# Patient Record
Sex: Female | Born: 1940 | Race: White | Hispanic: No | Smoking: Current some day smoker
Health system: Southern US, Community
[De-identification: ages and names within clinical notes are randomized; demographics above are authoritative.]

## PROBLEM LIST (undated history)

## (undated) DIAGNOSIS — K219 Gastro-esophageal reflux disease without esophagitis: Secondary | ICD-10-CM

## (undated) DIAGNOSIS — F32A Depression, unspecified: Secondary | ICD-10-CM

## (undated) DIAGNOSIS — I10 Essential (primary) hypertension: Secondary | ICD-10-CM

## (undated) DIAGNOSIS — Z87442 Personal history of urinary calculi: Secondary | ICD-10-CM

## (undated) DIAGNOSIS — E7800 Pure hypercholesterolemia, unspecified: Secondary | ICD-10-CM

## (undated) DIAGNOSIS — F329 Major depressive disorder, single episode, unspecified: Secondary | ICD-10-CM

## (undated) DIAGNOSIS — G4730 Sleep apnea, unspecified: Secondary | ICD-10-CM

## (undated) DIAGNOSIS — J449 Chronic obstructive pulmonary disease, unspecified: Secondary | ICD-10-CM

## (undated) DIAGNOSIS — I2699 Other pulmonary embolism without acute cor pulmonale: Secondary | ICD-10-CM

## (undated) DIAGNOSIS — F319 Bipolar disorder, unspecified: Secondary | ICD-10-CM

## (undated) DIAGNOSIS — J439 Emphysema, unspecified: Secondary | ICD-10-CM

## (undated) DIAGNOSIS — M5136 Other intervertebral disc degeneration, lumbar region: Secondary | ICD-10-CM

## (undated) HISTORY — PX: TONSILLECTOMY: SUR1361

## (undated) HISTORY — DX: Emphysema, unspecified: J43.9

## (undated) HISTORY — DX: Other pulmonary embolism without acute cor pulmonale: I26.99

## (undated) HISTORY — DX: Chronic obstructive pulmonary disease, unspecified: J44.9

## (undated) HISTORY — PX: CATARACT EXTRACTION, BILATERAL: SHX1313

## (undated) HISTORY — PX: BACK SURGERY: SHX140

## (undated) HISTORY — PX: ABDOMINAL HYSTERECTOMY: SHX81

## (undated) HISTORY — DX: Other intervertebral disc degeneration, lumbar region: M51.36

---

## 1998-11-07 ENCOUNTER — Inpatient Hospital Stay (HOSPITAL_COMMUNITY): Admission: EM | Admit: 1998-11-07 | Discharge: 1998-11-09 | Payer: Self-pay | Admitting: Cardiology

## 2000-01-23 ENCOUNTER — Inpatient Hospital Stay (HOSPITAL_COMMUNITY): Admission: EM | Admit: 2000-01-23 | Discharge: 2000-01-24 | Payer: Self-pay | Admitting: Emergency Medicine

## 2000-01-23 ENCOUNTER — Encounter: Payer: Self-pay | Admitting: Emergency Medicine

## 2000-01-24 ENCOUNTER — Encounter: Payer: Self-pay | Admitting: Cardiology

## 2000-05-12 ENCOUNTER — Ambulatory Visit (HOSPITAL_COMMUNITY): Admission: RE | Admit: 2000-05-12 | Discharge: 2000-05-12 | Payer: Self-pay | Admitting: Orthopedic Surgery

## 2000-05-12 ENCOUNTER — Encounter: Payer: Self-pay | Admitting: Orthopedic Surgery

## 2001-02-19 ENCOUNTER — Encounter: Payer: Self-pay | Admitting: Neurosurgery

## 2001-02-24 ENCOUNTER — Inpatient Hospital Stay (HOSPITAL_COMMUNITY): Admission: AD | Admit: 2001-02-24 | Discharge: 2001-02-28 | Payer: Self-pay | Admitting: Neurosurgery

## 2001-02-24 ENCOUNTER — Encounter: Payer: Self-pay | Admitting: Neurosurgery

## 2001-04-16 ENCOUNTER — Encounter: Payer: Self-pay | Admitting: Neurosurgery

## 2001-04-16 ENCOUNTER — Encounter: Admission: RE | Admit: 2001-04-16 | Discharge: 2001-04-16 | Payer: Self-pay | Admitting: Neurosurgery

## 2001-05-15 ENCOUNTER — Encounter: Admission: RE | Admit: 2001-05-15 | Discharge: 2001-05-15 | Payer: Self-pay | Admitting: Neurosurgery

## 2001-05-15 ENCOUNTER — Encounter: Payer: Self-pay | Admitting: Neurosurgery

## 2001-08-20 ENCOUNTER — Ambulatory Visit (HOSPITAL_COMMUNITY): Admission: RE | Admit: 2001-08-20 | Discharge: 2001-08-20 | Payer: Self-pay | Admitting: Internal Medicine

## 2001-08-20 ENCOUNTER — Encounter: Payer: Self-pay | Admitting: Internal Medicine

## 2001-09-17 ENCOUNTER — Encounter: Payer: Self-pay | Admitting: Neurosurgery

## 2001-09-17 ENCOUNTER — Ambulatory Visit (HOSPITAL_COMMUNITY): Admission: RE | Admit: 2001-09-17 | Discharge: 2001-09-17 | Payer: Self-pay | Admitting: Neurosurgery

## 2001-11-13 ENCOUNTER — Encounter: Payer: Self-pay | Admitting: Neurosurgery

## 2001-11-13 ENCOUNTER — Encounter: Admission: RE | Admit: 2001-11-13 | Discharge: 2001-11-13 | Payer: Self-pay | Admitting: Neurosurgery

## 2002-01-25 ENCOUNTER — Encounter: Payer: Self-pay | Admitting: Emergency Medicine

## 2002-01-25 ENCOUNTER — Emergency Department (HOSPITAL_COMMUNITY): Admission: EM | Admit: 2002-01-25 | Discharge: 2002-01-25 | Payer: Self-pay | Admitting: Emergency Medicine

## 2002-01-26 ENCOUNTER — Encounter: Admission: RE | Admit: 2002-01-26 | Discharge: 2002-04-26 | Payer: Self-pay

## 2003-07-13 ENCOUNTER — Ambulatory Visit (HOSPITAL_COMMUNITY): Admission: RE | Admit: 2003-07-13 | Discharge: 2003-07-13 | Payer: Self-pay | Admitting: *Deleted

## 2003-11-18 ENCOUNTER — Emergency Department (HOSPITAL_COMMUNITY): Admission: EM | Admit: 2003-11-18 | Discharge: 2003-11-18 | Payer: Self-pay | Admitting: Emergency Medicine

## 2005-02-14 ENCOUNTER — Emergency Department (HOSPITAL_COMMUNITY): Admission: EM | Admit: 2005-02-14 | Discharge: 2005-02-14 | Payer: Self-pay | Admitting: Emergency Medicine

## 2005-09-02 ENCOUNTER — Emergency Department (HOSPITAL_COMMUNITY): Admission: EM | Admit: 2005-09-02 | Discharge: 2005-09-02 | Payer: Self-pay | Admitting: Emergency Medicine

## 2005-09-12 ENCOUNTER — Encounter (HOSPITAL_COMMUNITY): Admission: RE | Admit: 2005-09-12 | Discharge: 2005-10-05 | Payer: Self-pay | Admitting: Orthopaedic Surgery

## 2005-11-19 ENCOUNTER — Ambulatory Visit (HOSPITAL_COMMUNITY): Admission: RE | Admit: 2005-11-19 | Discharge: 2005-11-19 | Payer: Self-pay | Admitting: Ophthalmology

## 2005-12-17 ENCOUNTER — Ambulatory Visit (HOSPITAL_COMMUNITY): Admission: RE | Admit: 2005-12-17 | Discharge: 2005-12-17 | Payer: Self-pay | Admitting: Ophthalmology

## 2006-02-03 ENCOUNTER — Encounter: Admission: RE | Admit: 2006-02-03 | Discharge: 2006-02-10 | Payer: Self-pay | Admitting: Orthopaedic Surgery

## 2009-05-26 ENCOUNTER — Emergency Department (HOSPITAL_COMMUNITY): Admission: EM | Admit: 2009-05-26 | Discharge: 2009-05-26 | Payer: Self-pay | Admitting: Emergency Medicine

## 2009-06-14 ENCOUNTER — Encounter: Payer: Self-pay | Admitting: Cardiology

## 2009-07-13 ENCOUNTER — Encounter: Payer: Self-pay | Admitting: Cardiology

## 2009-08-10 ENCOUNTER — Encounter: Payer: Self-pay | Admitting: Cardiology

## 2009-08-22 ENCOUNTER — Ambulatory Visit: Payer: Self-pay | Admitting: Cardiology

## 2009-08-22 ENCOUNTER — Telehealth (INDEPENDENT_AMBULATORY_CARE_PROVIDER_SITE_OTHER): Payer: Self-pay | Admitting: *Deleted

## 2009-08-22 ENCOUNTER — Encounter (INDEPENDENT_AMBULATORY_CARE_PROVIDER_SITE_OTHER): Payer: Self-pay | Admitting: *Deleted

## 2009-08-22 DIAGNOSIS — I2510 Atherosclerotic heart disease of native coronary artery without angina pectoris: Secondary | ICD-10-CM | POA: Insufficient documentation

## 2009-08-22 DIAGNOSIS — I10 Essential (primary) hypertension: Secondary | ICD-10-CM

## 2009-08-22 DIAGNOSIS — F17200 Nicotine dependence, unspecified, uncomplicated: Secondary | ICD-10-CM

## 2009-08-22 DIAGNOSIS — E785 Hyperlipidemia, unspecified: Secondary | ICD-10-CM

## 2009-08-22 DIAGNOSIS — I739 Peripheral vascular disease, unspecified: Secondary | ICD-10-CM

## 2009-08-30 ENCOUNTER — Ambulatory Visit: Payer: Self-pay | Admitting: Cardiology

## 2009-08-30 ENCOUNTER — Encounter: Payer: Self-pay | Admitting: Cardiology

## 2009-08-31 ENCOUNTER — Telehealth (INDEPENDENT_AMBULATORY_CARE_PROVIDER_SITE_OTHER): Payer: Self-pay | Admitting: *Deleted

## 2009-10-16 ENCOUNTER — Telehealth (INDEPENDENT_AMBULATORY_CARE_PROVIDER_SITE_OTHER): Payer: Self-pay | Admitting: *Deleted

## 2009-12-12 ENCOUNTER — Ambulatory Visit: Payer: Self-pay | Admitting: Cardiology

## 2009-12-16 ENCOUNTER — Emergency Department (HOSPITAL_COMMUNITY)
Admission: EM | Admit: 2009-12-16 | Discharge: 2009-12-16 | Payer: Self-pay | Source: Home / Self Care | Admitting: Emergency Medicine

## 2010-02-08 NOTE — Letter (Signed)
Summary: External Correspondence/ VISIT [facility]  External Correspondence/ VISIT [facility]   Imported By: Dorise Hiss 08/15/2009 15:44:09  _____________________________________________________________________  External Attachment:    Type:   Image     Comment:   External Document

## 2010-02-08 NOTE — Letter (Signed)
Summary: External Correspondence/ VISIT [facility]  External Correspondence/ VISIT [facility]   Imported By: Dorise Hiss 08/15/2009 15:41:35  _____________________________________________________________________  External Attachment:    Type:   Image     Comment:   External Document

## 2010-02-08 NOTE — Progress Notes (Signed)
Summary: ? CONTINUE LISINOPRIL WITH AMLODIPINE  Phone Note Call from Patient Call back at Home Phone [phone]   Caller: Patient Call For: nurse Summary of Call: patient called to confirm whether or not she was to stay on her lisinopril in additon to her new med she was started on. Nurse informed her that unless she was instructed to stop it, she should stay on this. After review of chart, med should still be taken. Patient informed to stay on lisinopril. Initial call taken by: Carlye Grippe,  August 31, 2009 3:03 PM

## 2010-02-08 NOTE — Medication Information (Signed)
Summary: RX Folder/ MEDICATION SHEETS [facility]  RX Folder/ MEDICATION SHEETS [facility]   Imported By: Dorise Hiss 08/15/2009 15:39:54  _____________________________________________________________________  External Attachment:    Type:   Image     Comment:   External Document

## 2010-02-08 NOTE — Assessment & Plan Note (Signed)
Summary: 2 mo r/s from cxl 10/17   Visit Type:  Follow-up Primary Christine Rogers:  Dr. [name]  CC:  HTN.  History of Present Illness: The patient presents for followup of difficult to control hypertension. At the last visit I started amlodipine. She tolerated this and her blood pressures have been well controlled. He denies any cardiovascular symptoms such as chest discomfort, neck or arm discomfort. She has had no palpitations, presyncope or syncope. She has had no weight gain or edema. She has some burning left lower leg discomfort. However, recent ABIs demonstrated only very mild decreased flow. Stress perfusion study was recently normal.  Preventive Screening-Counseling & Management  Alcohol-Tobacco     Smoking Status: current     Smoking Cessation Counseling: yes     Packs/Day: 1 PPD  Current Medications (verified): 1)  Lisinopril 2.5 Mg Tabs (Lisinopril) .... Take 1 Tablet By Mouth Once A Day 2)  Oxycontin 80 Mg Xr12h-Tab (Oxycodone Hcl) .... Take 1 Tablet By Mouth Three Times A Day As Needed 3)  Pantoprazole Sodium 40 Mg Tbec (Pantoprazole Sodium) .... Take 1 Tablet By Mouth Once A Day 4)  Abilify 20 Mg Tabs (Aripiprazole) .... Take 1 Tablet By Mouth Once A Day 5)  Effexor Xr 75 Mg Xr24h-Cap (Venlafaxine Hcl) .... Take 1 Tablet By Mouth Once A Day 6)  Percocet 10-325 Mg Tabs (Oxycodone-Acetaminophen) .... As Needed 7)  Amlodipine Besylate 2.5 Mg Tabs (Amlodipine Besylate) .... Take One Tablet By Mouth Daily 8)  Lipitor 20 Mg Tabs (Atorvastatin Calcium) .... Take One Tablet By Mouth At Bedtime.  Allergies (verified): 1)  ! Demerol 2)  ! Morphine 3)  ! Cipro  Comments:  Nurse/Medical Assistant: The patient is currently on medications but does not know the name or dosage at this time. Instructed to contact our office with details. Will update medication list at that time.  Past History:  Past Medical History: Reviewed history from 08/22/2009 and no changes  required. HTN GERD Depression Chronic lumbar back pain CAD  Past Surgical History: Reviewed history from 08/22/2009 and no changes required. Decompressive laminectomy, stabilization at L4-5 using Tangent   allograft wedges and CD Horizon pedicle screw system. Trigger finger release Cataracts Hysterectomy  Social History: Packs/Day:  1 PPD  Review of Systems       As stated in the HPI and negative for all other systems.   Vital Signs:  Patient profile:   70 year old female Height:      61 inches Weight:      99 pounds Pulse rate:   85 / minute BP sitting:   144 / 87  (left arm) Cuff size:   regular  Vitals Entered By: Carlye Grippe (December 12, 2009 10:24 AM)  Physical Exam  General:  Well developed, well nourished, in no acute distress. Head:  normocephalic and atraumatic Neck:  Neck supple, no JVD. No masses, thyromegaly or abnormal cervical nodes. Chest Wall:  no deformities or breast masses noted Lungs:  Clear bilaterally to auscultation and percussion. Abdomen:  Bowel sounds positive; abdomen soft and non-tender without masses, organomegaly, or hernias noted. No hepatosplenomegaly. Msk:  Back normal, normal gait. Muscle strength and tone normal. Extremities:  No clubbing or cyanosis. Neurologic:  Alert and oriented x 3. Cervical Nodes:  no significant adenopathy Psych:  Normal affect.   Detailed Cardiovascular Exam  Neck    Carotids: Carotids full and equal bilaterally without bruits.      Neck Veins: Normal, no JVD.  Heart    Inspection: no deformities or lifts noted.      Palpation: normal PMI with no thrills palpable.      Auscultation: regular rate and rhythm, S1, S2 without murmurs, rubs, gallops, or clicks.    Vascular    Abdominal Aorta: no palpable masses, pulsations, or audible bruits.      Femoral Pulses: normal femoral pulses bilaterally, left and right bruit    Pedal Pulses: diminished left dorsalis pedis pulse and diminished left  posterior tibial pulse.      Radial Pulses: normal radial pulses bilaterally.      Peripheral Circulation: no clubbing, cyanosis, or edema noted with normal capillary refill.     Impression & Recommendations:  Problem # 1:  TOBACCO ABUSE (ICD-305.1) We again discussed the need to stop smoking we discussed a specific strategy.  Problem # 2:  ESSENTIAL HYPERTENSION, BENIGN (ICD-401.1) Her blood pressure is now well controlled on the meds as listed. She will continue with this regimen.  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  Patient Instructions: 1)  Follow up as needed.

## 2010-02-08 NOTE — Letter (Signed)
Summary: External Correspondence/ VISIT [facility]  External Correspondence/ VISIT [facility]   Imported By: Dorise Hiss 08/15/2009 15:42:57  _____________________________________________________________________  External Attachment:    Type:   Image     Comment:   External Document

## 2010-02-08 NOTE — Progress Notes (Signed)
Summary: PHONE: BP MEDICATION  Phone Note Call from Patient Call back at Home Phone [phone]   Caller: Patient Summary of Call: Wants to know when is the best time to take her BP medication.  Initial call taken by: Zachary George,  August 22, 2009 4:34 PM  Follow-up for Phone Call        Pt notified she can take Amlodipine in the morning.  She was also notified to start Lipitor 20mg  per Dr. Antoine Poche. Follow-up by: Cyril Loosen, RN, BSN,  August 22, 2009 5:26 PM    New/Updated Medications: LIPITOR 20 MG TABS (ATORVASTATIN CALCIUM) Take one tablet by mouth at bedtime. Prescriptions: LIPITOR 20 MG TABS (ATORVASTATIN CALCIUM) Take one tablet by mouth at bedtime.  #30 x 6   Entered by:   Cyril Loosen, RN, BSN   Authorized by:   Rollene Rotunda, MD, [facility]   Signed by:   Cyril Loosen, RN, BSN on 08/22/2009   Method used:   Electronically to        [facility] (retail)       [address]       [place], [place]  [address]       Ph: [phone]       Fax: [phone]   RxID:   [id number]

## 2010-02-08 NOTE — Progress Notes (Signed)
Summary: Pending Labs  Phone Note Call from Patient Call back at Home Phone [phone]   Summary of Call: Pt would like to know if she should have labs (FLP/LFT) done before her appt with Dr. Antoine Poche on 10/17. Notified pt she could do labs on 10/13 or 10/14. Pt is aware to be NPO for labs. Initial call taken by: Cyril Loosen, RN, BSN,  October 16, 2009 9:16 AM

## 2010-02-08 NOTE — Progress Notes (Signed)
----   Converted from flag ---- ---- 08/22/2009 4:34 PM, Rollene Rotunda, MD, [facility] wrote: She needs lipitor 20 mg daily as well ------------------------------

## 2010-02-08 NOTE — Letter (Signed)
Summary: Lexiscan or Dobutamine Pharmacist, community at [facility]  [address]   [place], [place] [address]   Phone: [phone]  Fax: [phone]      [facility] Cardiovascular Services  Lexiscan or Dobutamine Cardiolite Strss Test    [facility]  Appointment Date:_  Appointment Time:_  Your doctor has ordered a CARDIOLITE STRESS TEST using a medication to stimulate exercise so that you will not have to walk on the treadmill to determine the condition of your heart during stress. If you take blood pressure medication, ask your doctor if you should take it the day of your test. You should not have anything to eat or drink at least 4 hours before your test is scheduled, and no caffeine, including decaffeinated tea and coffee, chocolate, and soft drinks for 24 hours before your test.  You will need to register at the Outpatient/Main Entrance at the hospital 15 minutes before your appointment time. It is a good idea to bring a copy of your order with you. They will direct you to the Diagnostic Imaging (Radiology) Department.  You will be asked to undress from the waist up and given a hospital gown to wear, so dress comfortably from the waist down for example: Sweat pants, shorts, or skirt Rubber soled lace up shoes (tennis shoes)  Plan on about three hours from registration to release from the hospital  You may take your medications with water the morning of your test.   If the results of your test are normal or stable, you will receive a letter. If they are abnormal, the nurse will contact you by phone.

## 2010-02-08 NOTE — Letter (Signed)
Summary: Discharge Summary/ [place]  Discharge Summary/ [place]   Imported By: Dorise Hiss 08/22/2009 08:42:46  _____________________________________________________________________  External Attachment:    Type:   Image     Comment:   External Document

## 2010-02-08 NOTE — Cardiovascular Report (Signed)
Summary: Cardiac Catheterization  Cardiac Catheterization   Imported By: Dorise Hiss 08/22/2009 08:38:57  _____________________________________________________________________  External Attachment:    Type:   Image     Comment:   External Document

## 2010-02-08 NOTE — Progress Notes (Signed)
Summary: Office Visit/ BLOOD PRESSURE READINGS  Office Visit/ BLOOD PRESSURE READINGS   Imported By: Dorise Hiss 08/23/2009 09:21:16  _____________________________________________________________________  External Attachment:    Type:   Image     Comment:   External Document

## 2010-02-08 NOTE — Assessment & Plan Note (Signed)
Summary: NP-VIOTLE   Visit Type:  Initial Consult Primary Elicia Lui:  Dr. [name]  CC:  HTN.  History of Present Illness: The patient presents for evaluation of difficult to control hypertension. Apparently it has been somewhat labile. She brings a blood pressure diary today that suggest systolic blood pressures consistently in the 160s. She apparently said some lower blood pressures with symptoms when she has been on a higher dose of lisinopril. She's never had any presyncope or syncope. We had seen this patient in the past for evaluation of chest pain. She had a catheterization most recently in 2002 with a 50-70% LAD stenosis. This was managed medically. She's had no further studies since that time. She is sedentary doing some light housekeeping. She says she somewhat limited by leg and calf cramping. She denies any chest pressure, neck or arm discomfort. She denies any palpitations, PND or orthopnea. She has no shortness of breath.  Preventive Screening-Counseling & Management  Alcohol-Tobacco     Smoking Status: current     Packs/Day: 1.0     Year Started: 30 yrs  Current Medications (verified): 1)  Lisinopril 2.5 Mg Tabs (Lisinopril) .... Take 1 Tablet By Mouth Once A Day 2)  Oxycontin 80 Mg Xr12h-Tab (Oxycodone Hcl) .... Take 1 Tablet By Mouth Three Times A Day As Needed 3)  Pantoprazole Sodium 40 Mg Tbec (Pantoprazole Sodium) .... Take 1 Tablet By Mouth Once A Day 4)  Abilify 20 Mg Tabs (Aripiprazole) .... Take 1 Tablet By Mouth Once A Day 5)  Effexor Xr 75 Mg Xr24h-Cap (Venlafaxine Hcl) .... Take 1 Tablet By Mouth Once A Day 6)  Percocet 10-325 Mg Tabs (Oxycodone-Acetaminophen) .... As Needed  Allergies: 1)  ! Demerol 2)  ! Morphine 3)  ! Cipro  Past History:  Past Medical History: HTN GERD Depression Chronic lumbar back pain CAD  Past Surgical History: Decompressive laminectomy, stabilization at L4-5 using Tangent   allograft wedges and CD Horizon pedicle  screw system. Trigger finger release Cataracts Hysterectomy  Family History: Father died age 9 MI Brother died age 15 MI Brother CABG age 58  Social History: Divorced Lives with two children Five years Tobacco 35 years 1ppdSmoking Status:  current Packs/Day:  1.0  Review of Systems       As stated in the HPI and negative for all other systems.   Vital Signs:  Patient profile:   70 year old female Height:      61 inches Weight:      107 pounds BMI:     20.29 Pulse rate:   74 / minute BP sitting:   162 / 83  (left arm) Cuff size:   regular  Vitals Entered By: Hoover Brunette, LPN (August 22, 2009 3:14 PM) CC: HTN Is Patient Diabetic? No Comments uncontrolled blood pressure    Physical Exam  General:  Well developed, well nourished, in no acute distress. Head:  normocephalic and atraumatic Eyes:  PERRLA/EOM intact; conjunctiva and lids normal. Mouth:  Edentulous. Oral mucosa normal. Neck:  Neck supple, no JVD. No masses, thyromegaly or abnormal cervical nodes. Chest Wall:  no deformities or breast masses noted Lungs:  Clear bilaterally to auscultation and percussion. Abdomen:  Bowel sounds positive; abdomen soft and non-tender without masses, organomegaly, or hernias noted. No hepatosplenomegaly. Msk:  Back normal, normal gait. Muscle strength and tone normal. Extremities:  No clubbing or cyanosis. Neurologic:  Alert and oriented x 3. Skin:  Intact without lesions or rashes. Cervical Nodes:  no significant  adenopathy Axillary Nodes:  no significant adenopathy Inguinal Nodes:  no significant adenopathy Psych:  Normal affect.   Detailed Cardiovascular Exam  Neck    Carotids: Carotids full and equal bilaterally without bruits.      Neck Veins: Normal, no JVD.    Heart    Inspection: no deformities or lifts noted.      Palpation: normal PMI with no thrills palpable.      Auscultation: regular rate and rhythm, S1, S2 without murmurs, rubs, gallops, or clicks.      Vascular    Abdominal Aorta: no palpable masses, pulsations, or audible bruits.      Femoral Pulses: normal femoral pulses bilaterally, left bruit    Pedal Pulses: diminished left dorsalis pedis pulse and diminished left posterior tibial pulse.      Radial Pulses: normal radial pulses bilaterally.      Peripheral Circulation: no clubbing, cyanosis, or edema noted with normal capillary refill.     EKG  Procedure date:  08/22/2009  Findings:      sinus rhythm, rate 72, axis within normal limits, intervals within normal limits, premature ventricular contractions, lateral T wave inversions without old EKGs for comparison  Impression & Recommendations:  Problem # 1:  ESSENTIAL HYPERTENSION, BENIGN (ICD-401.1) Apparently she had some labile hypertension. She didn't tolerate a higher dose of ACE inhibitor so I will start Norvasc 2.5 mg daily. She will keep a blood pressure diary at home and from this we will guide further meds titration.  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 She needs screening stress testing but says she wouldn't be to walk on a treadmill and so she will have a pharmacologic perfusion study. She needs aggressive risk reduction. Orders: Nuclear Med (Nuc Med)  Problem # 3:  TOBACCO ABUSE (ICD-305.1) We had a long discussion about the need to stop smoking (greater than 3 minutes).  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 I will check ABIs.  Problem # 5:  DYSLIPIDEMIA (ICD-272.4) I reviewed recent lipids. She needs a statin and I would start with Lipitor 20 mg daily for goal LDL less than 70.  Other Orders: EKG w/ Interpretation (93000) ABI (ABI)  Patient Instructions: 1)  Follow up with Dr. Antoine Poche on Monday, October 23, 2009 at 3:30pm. 2)  Your physician has  requested that you have an ankle brachial index (ABI). During this test an ultrasound and blood pressure cuff are used to evaluate the arteries that supply the arms and legs with blood. Allow thirty minutes for this exam. There are no restrictions or special instructions. 3)  Your physician has requested that you have an Best boy.  For further information please visit [email or web address]  Please follow instruction sheet, as given. 4)  Amlodipine 2.5mg  by mouth once daily. Prescriptions: AMLODIPINE BESYLATE 2.5 MG TABS (AMLODIPINE BESYLATE) Take one tablet by mouth daily  #30 x 6   Entered by:   Cyril Loosen, RN, BSN   Authorized by:   Rollene Rotunda, MD, [facility]   Signed by:   Cyril Loosen, RN, BSN on 08/22/2009   Method used:   Electronically to        [facility] (retail)       [address]       [place],  [place]  [address]       Ph: [phone]       Fax: [phone]   RxID:   [id number]   Handout requested.  I have reviewed and approved all prescriptions at the time of this visit. Rollene Rotunda, MD, [facility]  August 22, 2009 4:34 PM

## 2010-03-26 LAB — BASIC METABOLIC PANEL
BUN: 15 mg/dL (ref 6–23)
CO2: 24 mEq/L (ref 19–32)
Calcium: 8.8 mg/dL (ref 8.4–10.5)
Chloride: 104 mEq/L (ref 96–112)
GFR calc Af Amer: >60 mL/min (ref >60)
GFR calc non Af Amer: 58 mL/min — ABNORMAL LOW (ref >60)
Glucose, Bld: 88 mg/dL (ref 70–99)
Potassium: 3.5 mEq/L (ref 3.5–5.1)
Sodium: 136 mEq/L (ref 135–145)

## 2010-03-26 LAB — DIFFERENTIAL
Basophils Absolute: 0.1 10*3/uL (ref 0.0–0.1)
Basophils Relative: 1 % (ref 0–1)
Eosinophils Absolute: 0.1 10*3/uL (ref 0.0–0.7)
Eosinophils Relative: 1 % (ref 0–5)
Lymphocytes Relative: 19 % (ref 12–46)
Monocytes Absolute: 0.5 10*3/uL (ref 0.1–1.0)
Monocytes Relative: 6 % (ref 3–12)
Neutro Abs: 6.2 10*3/uL (ref 1.7–7.7)
Neutrophils Relative %: 74 % (ref 43–77)

## 2010-03-26 LAB — CBC: MCHC: 35.5 g/dL (ref 30.0–36.0)

## 2010-05-25 NOTE — Cardiovascular Report (Signed)
[place]. [facility]  Patient:    Christine Rogers, Christine Rogers                       MRN: [id number] Proc. Date: 01/24/00 Adm. Date:  [id number] Disc. Date: [id number] Attending:  Junious Silk CC:         Colon Flattery, D.O.  Madolyn Frieze Jens Som, M.D. [facility], [address], [place], [place] [address]  Cardiac Catheterization Laboratory   Cardiac Catheterization  PROCEDURE:  Selective coronary angiography, left ventricular angiography - Judkins technique.  INDICATIONS:  The patient is a 70 year old white female with a history of coronary artery disease with a previous study in 2000 revealing 70% mid LAD stenosis.  She now presents with an episode of precordial chest pain, severe, persisting 15 minutes associated with diaphoresis and shortness of breath.  RESULTS:  PRESSURES:  Aortic:  168/76, LV 160/17.  ANGIOGRAPHY:  There was no intracoronary calcification. 1. Left main:  The left main coronary artery was normal. 2. Left anterior descending:  The left anterior descending revealed    30% lesion in the first diagonal branch, 50-70% in the mid LAD,    somewhat more significant in the LAO view.  There was no significant    change with intracoronary nitroglycerin. 3. Circumflex:  The circumflex coronary was normal. 4. Right coronary artery:  The right coronary artery was normal. 5. Left ventricle:  The left ventricle revealed a normal EF.  There is a    question of focal hypokinesis in the distal anterior wall.  SUMMARY:  One-vessel disease as noted above.  I do not think there is significant change from the previous study.  I have suggested medical therapy and outpatient Cardiolite. DD:  01/24/00 TD:  01/25/00 Job: [id number]

## 2010-05-25 NOTE — H&P (Signed)
Christine Rogers, WALTER                          ACCOUNT NO.:  [id number]   MEDICAL RECORD NO.:  [id number]                   PATIENT TYPE:  REC   LOCATION:  [facility]PC                                  FACILITY:  [facility]   PHYSICIAN:  Zachary George, DO                      DATE OF BIRTH:  1940-05-01   DATE OF ADMISSION:  01/26/2002  DATE OF DISCHARGE:                                HISTORY & PHYSICAL   Dear Dr. Dewaine Conger:  Thank you very much for kindly referring this [facility] to the [facility] for evaluation.  She was evaluated in our clinic  today.  Please refer to the following for details regarding the history and  physical examination and treatment plan.  Once again, thank you for allowing  [place] to participate in the care of her.   CHIEF COMPLAINT:  Low back pain.   HISTORY OF PRESENT ILLNESS:  The patient is a pleasant 70 year old right-  hand dominant female who complains of a several year history of low back  pain with pain radiating to her left hip.  The patient underwent lumbar  fusion at L4-5 on February 24, 2001, which she states helped her hip pain  but now she has increased low back pain over the past several months.  She  states that she cannot sit and cannot walk with any degree of comfort  secondary to the pain.  She states her legs feel weak and her feet hurt and  ache.  She also complains of some newer onset pain in the right  thoracolumbar paraspinous region and she attributes this to the way she  sits.  She also describes pain with touching the skin at the inferior  portion of her incision on the left side which causes a fluttering  sensation radiating to her left hip.  Her pain today is a 7/10 on a  subjective scale.  She describes it as constant, sharp.  Her symptoms are  worse with bending, sitting and working and improved to some degree with  medications.   She had been on Vicodin 5 mg per her neurosurgeon, Dr. Wynetta Emery, until December,  2003,  when he released her from his care per her report.  The Vicodin was  helping to some degree.  She has also tried Celebrex, Neurontin, Relafen,  Robaxin and Ultram prior to her surgery without relief.  She has not been on  Ultram or Ultracet since the surgery.  She has tried Lidoderm patches which  seem to help.  She has been through physical therapy postoperatively without  any significant improvement.   Prior to her surgery she underwent two lumbar epidural steroid injections  without any relief.  Overall, her function of quality of life indices have  declined, her sleep is poor.  She denies bowel and bladder dysfunction,  denies fever or chills,  night sweats, weight loss.  I reviewed health and  history form and 14-point review of systems.  She admits to depression,  frequent urination, irregular heartbeat.   PAST MEDICAL HISTORY:  Mild coronary artery disease.  The patient states she  was told she has a blockage in one of her coronary arteries but not  significant enough to undergo any type of procedure for it.  She has  hypertension, occasional headache, history of depression and  gastroesophageal reflux disease.   PAST SURGICAL HISTORY:  Lumbar fusion L4-5 on February 24, 2001, per Dr.  Wynetta Emery.  She has also undergone partial hysterectomy in 1993.   FAMILY HISTORY:  Heart disease, hypertension.   SOCIAL HISTORY:  The patient smokes one pack of cigarettes per day and I  counseled her on the importance of smoking cessation in terms of pain and  overall health.  She denies alcohol use and she is married.  She currently  works at [employer] in [employer] and states that she cannot  afford to quit work.   ALLERGIES:  No known drug allergies.   MEDICATIONS:  1. Ecotrin.  2. Zyrtec.  3. Protonix.  4. Biofreeze.  5. Previous use of Vicodin and Valium.   PHYSICAL EXAMINATION:  GENERAL:  Reveals a healthy appearing female in no  acute distress.  VITALS:  Blood  pressure 149/83, pulse 78, respirations 20, oxygen saturation  100% on room air.  NEUROLOGICAL:  Mood and affect are appropriate.  The patient is alert and  oriented.  Gait is normal.  BACK:  Examination of the back reveals slightly lower left hemipelvis  compared to the right.  There is no gross scoliosis.  There is slightly  decreased lumbar lordosis with a vertical midline incisional scar.  Range of  motion of the lumbar spine is guarded in all planes secondary to discomfort.  Palpatory examination reveals tenderness to palpation in the bilateral  lumbar paraspinal muscles primarily over the L5-S1 region.  There is also  slight dysthetic sensation to light touch in the left lower lumbar region at  the inferior aspect of her scar and radiating out towards her gluteal  muscles.  There is tenderness to palpation with trigger point in the right  thoracolumbar paraspinous region with ropey texture.  EXTREMITIES:  Manual muscle testing is 5/5 in bilateral lower extremities.  Sensory examination is intact to light touch bilateral lower extremities.  Muscle stretch reflexes are 1+/4 bilateral patellae and medial hamstrings  and 0/4 bilateral Achilles.  Straight leg raise is negative bilaterally.  FABER is negative bilaterally.  The patient has tight hip hamstrings and hip  flexors bilaterally.  There is no abnormal tone noted in the lower  extremities.   LABORATORY DATA:  MRI of the lumbar spine reviewed.  This reveals  degenerative disk changes with fusion of L4-5 and post surgical changes.  There is facet arthropathy noted.   IMPRESSION:  1. Chronic low back pain, mechanical and myofascial.  2. Degenerative disk disease of the lumbar spine, status post L4-5 fusion.  3. Lumbar facet arthropathy.  I suspect a component of the patient's pain is     coming from the facet joints at L5-S1.  This is not uncommon after  L4-5     fusion.  4. Nonrestorative sleep disorder. 5. Depression.    PLAN:  1. Discussed treatment options with the patient to include conservative and     interventional procedures to help control her pain.  Would like  to pursue     non-narcotic medications for pain if possible and will begin Ultracet 1-2     p.o. q.i.d. as needed for pain, #60 without refills.  I also supplied her     with 20 sample pills.  2. Lidoderm 5%, apply up to 12 hours daily, maximum three patches at a time,     #30 with two refills.  3. Pamelor 10 mg 1-2 p.o. q. h.s. for sleep capacity, #60 without refills.  4. Consider re-initiating Effexor.  5. Consider changing sleeping surface to a Tempur-Pedic mattress and I     discussed this with the patient at length.  6. Consider interventional procedures to help facilitate pain control to     include diagnostic/therapeutic lumbar facet injections and possibly     trigger point injection to the above noted trigger point.  The patient     wishes to proceed with more conservative measures over the next month or     so.  If things aren't improving, then would give interventional     procedures consideration.  7. The patient is to return to clinic in one month for re-evaluation.   The patient was educated on the above findings and recommendations and  understands.  There were no barriers to communication.  The patient was  instructed to follow up with her primary care Delquan Poucher as well.                                               Zachary George, DO    JW/MEDQ  D:  01/27/2002  T:  01/27/2002  Job:  [id number]   cc:   Colon Flattery, D.O., [address] [place], [place] [address]

## 2010-05-25 NOTE — Discharge Summary (Signed)
[place]. [facility]  Patient:    RENLEY, GUTMAN Visit Number: [id number] MRN: [id number]          Service Type: Attending:  Garlon Hatchet., M.D. Dictated by:   Garlon Hatchet., M.D. Adm. Date:  02/24/01 Disc. Date: 02/28/01                             Discharge Summary  ADMITTING DIAGNOSES:  Spondylolisthesis and degenerative disk disease, L4-5.  PROCEDURE:  Decompressive laminectomy, stabilization at L4-5 using Tangent allograft wedges and CD Horizon pedicle screw system.  HISTORY OF PRESENT ILLNESS:  The patient is a very pleasant 70 year old female who has had long-standing back and leg pain refractory to conservative treatment.  HOSPITAL COURSE:  The patient was admitted as a MA and went to the operating room and underwent the aforementioned procedure.  Postop, the patient did very well and went to the recovery room and the floor.  On the floor the patient was afebrile with stable vital signs.  Leg pain was significantly improved. Back was under a moderate amount of pain.  The was slowly progressed and mobilized more and more.  She remained afebrile the next couple of days.  Her leg pain remained gone.  She was having back pain, but she got progressively more mobile with physical therapy and by postop day 4, the patient was able to be discharged home.  DISPOSITION:  Discharged to follow up in two weeks, on p.o. pain medicine and lumbar corset. Dictated by:   Garlon Hatchet., M.D. Attending:  Donalee Citrin, Montez Hageman., M.D. DD:  05/08/01 TD:  05/08/01 Job: [id number]

## 2010-05-25 NOTE — Discharge Summary (Signed)
[place]. [facility]  Patient:    Christine Rogers, Christine Rogers                       MRN: [id number] Adm. Date:  [id number] Attending:  Junious Silk Dictator:   ______________ CC:         Colon Flattery, D.O., [place], [place]  Madolyn Frieze. Jens Som, M.D., [facility], [facility], [address], [place], [place] [address]   Discharge Summary  DATE OF BIRTH:  1940-05-20  HISTORY:  Christine Rogers is a 70 year old female who presented with left-sided chest pressure that began at approximately 8:30 on the morning of admission.  She gave it a 4 on a scale of 0-10 and it was associated with shortness of breath and diaphoresis.  It lasted approximately 15 minutes.  She cannot recall any aggravating or relieving factors.  She had spontaneous resolution.  There was increased weakness.  She has a history of coronary artery disease and her last catheterization was in November 2000.  This showed a 70-75% LAD, proximal 30% RCA, 30-50% PDA, EF of 74%.  Stress Cardiolite at that time showed mild inferior and apical ischemia.  The LAD was not felt to be a culprit lesion and to continue medical treatment.  She also has a history of continuing tobacco use, hypertension, family history, and hyperlipidemia.  Questionable history of rhabdomyolysis on Lipitor.  Currently she is not on any medications for her hyperlipidemia.  LABORATORY DATA:  Admission sodium was 142, potassium 4.1, although slightly hemolyzed.  BUN 8, creatinine 0.6, glucose 88.  PT 13.0, PTT 28.  CKs and troponins are negative for myocardial infarction.  H&H 12.8 and 36.9, normal indices.  Platelets 267.  WBC 7.9.  Chest x-ray did not show any abnormality.  EKGs showed sinus bradycardia, left axis deviation, left anterior hemiblock.  For her rate, her Q-T was slightly prolonged at 524 and QTC of 492.  HOSPITAL COURSE: #1 - Ms. Vora was admitted to 5100 at [facility].  Overnight she did not have any further chest  discomfort and cardiac catheterization was performed on the 17th by Dr. Corinda Gubler.  According to his progress note, she had a 50-70% LAD. Question if this is more significant in LAO view.  She had 30% diagonal.  Her circumflex and RCA were felt to be normal.  Dr. Juanda Chance and Dr. Jens Som reviewed the films and felt that she should continue medical treatment.  He also recommended for her to have another stress Cardiolite for further evaluation.  Post procedure she was removed from bed rest.  She was able to ambulate in the [place] without difficulty.  Catheterization site was intact. Thus she was discharged home with a diagnoses of atypical chest discomfort, doubt cardiac etiology, nonobstructive coronary artery disease as previously described.  History as previously.  DISPOSITION:  She was asked to increase her Toprol XL to 25 mg q.d.  Her other medications include Paxil 10 mg q.d.; Premarin 0.625 mg q.d.; zyrtec 10 mg q.d.; Arthrotec 75 mg q.d.; Vicodin as needed; Protonix 40 mg q.d.; sublingual nitroglycerin as needed; and aspirin 325 mg q.d.  DISCHARGE INSTRUCTIONS:  She was advised no lifting, driving, sexual activity, or heavy exertion for two days.  She was advised to discontinue smoking and tobacco products.  DIET:  Maintain low salt, fat, cholesterol diet.  WOUND CARE:  If she has any problems with her catheterization site, she was asked to call immediately.  FOLLOWUP:  Our [place] office will call her at home with arrangements for a stress Cardiolite and follow-up appointment with Dr. Jens Som. DD:  01/24/00 TD:  01/25/00 Job: [id number]

## 2010-05-25 NOTE — Discharge Summary (Signed)
[place]. [facility]  Patient:    SHRADDHA, LEBRON Visit Number: [id number] MRN: [id number]          Service Type: Attending:  Garlon Hatchet., M.D. Dictated by:   Garlon Hatchet., M.D. Adm. Date:  02/24/01 Disc. Date: 02/28/01                             Discharge Summary  ADMITTING DIAGNOSIS:  Lumbar spinal stenosis, degenerative disk disease, and spondylolisthesis, L4-5.  PROCEDURE:  Decompressive lumbar laminectomy and microdiskectomy, L4-5, with posterior lumbar interbody fusion L4-5.  HOSPITAL COURSE:  The patient was admitted as an a.m. admit and went to the operating room and underwent the aforementioned procedure.  Postop the patient did very well, went to recovery room and then the floor.  On the floor the patient was afebrile with stable vital signs.  Strength was 5/5.  Pain in the legs was significantly improved.  She was slowly progressed and mobilized over the next two days.  She had a significant amount of back pain that was slow to control on IV pain medicines.  She was worked up by physical therapy and on postop day 3, the patient spiked a temperature to 101.3.  She was given some Tylenol and mobilized and over the next several hours and the next day, the patient was afebrile and was able to be discharged home on the 22nd.  DISCHARGE PLAN:  Discharge follow-up in two weeks, on Percocet and Valium. Dictated by:   Garlon Hatchet., M.D. Attending:  Donalee Citrin, Montez Hageman., M.D. DD:  04/14/01 TD:  04/14/01 Job: [id number]

## 2010-05-25 NOTE — Op Note (Signed)
[place]. [facility]  Patient:    Christine Rogers, Christine Rogers Visit Number: [id number] MRN: [id number]          Service Type: DSU Location: 3000 [phone] Attending Physician:  Mariam Dollar Dictated by:   Garlon Hatchet., M.D. Proc. Date: 02/24/01 Admit Date:  02/24/2001                             Operative Report  PREOPERATIVE DIAGNOSIS:  Spinal instability and lateral recess stenosis, L4-5 with discogenic mechanical low back pain at L4-5.  PROCEDURE:  Decompressive lumbar laminectomy at L4-5, posterior lumbar interbody fusion at L4-5 using 8 x 24 mm retention allograft wedges, pedicle screw fixation nonsegmental L4-5 using 6.5 x 40 mm screws at L4 via MH 55 system and 6.5 x 35 at L5.  Posterolateral arthrodesis at L4-5 and open reduction of spinal deformity L4-5.  SURGEON:  Garlon Hatchet., M.D.  ASSISTANT:  Karene Fry, M.D.  ANESTHESIA:  General endotracheal.  HISTORY OF PRESENT ILLNESS:  The patient is a very pleasant 70 year old female with a longstanding back and leg pain radiating down the top of her foot and big toe.  It has been refractory to conservative treatment with physical therapy and steroid injections over several years.  Preoperative imaging showed a reversal of her lumbar lordosis at L4-5 due to collapse and severe degeneration of the L4-5 disk and the anterior disk rupture, as well as lateral recess stenosis from both disk bulges and facet arthropathy.  The patient had predominantly back pain with secondary leg pain.  Due to the mechanical nature of the patients back pain and her imaging, the patient was recommended a decompression stabilization procedure.  After assessment of the risk and benefits of surgery with her, she understood and agreed to proceed forth.  DESCRIPTION OF PROCEDURE:  The patient was brought into the OR and was induced with general endotracheal anesthesia.  The patient was positioned prone in the [place] frame.  The back  was prepped and draped in the usual sterile fashion. After infiltration of 10 cc of lidocaine with epinephrine, a midline incision was made with a #11 blade scalpel.  Bovie electrocautery was used to take down the subcutaneous tissue and the fascia was then divided bilaterally.  A subperiosteal dissection was carried out in the lumen of L3, 4, and 5.  Then, an intraoperative x-ray confirmed localization of the GP at L5 and subsequently L4.  Then, the remainder of the spinous process lamina and medial facet complexes were removed with a combination of Leksell rongeur and 3 and 4 mm Kerrison punch.  The L4 nerve root was identified and decompressed directly on its foramen as well as the L5 nerve root.  The entire medial facet complex was removed at L4-5 and disk edges visualized.  The epidural vein was coagulated and after thecal sac in both L4 and L5 nerve roots were radically decompressed, attention was first taken to pedicle screw placement using fluoroscopy.  A high speed drill was used to drill pilot holes at L4.  These were cannulated with 5.5 tap and a 6.5 x 48 mm pedicle screw was inserted at L4.  All pedicles were probed at each step along the way and noted to be competent to 360 degree rotation.  Fluoroscopy confirmed good injectory, location, and depth of all pedicle screws.  At L5-6, 5 x 35 screws were inserted in a similar orientation.  Then, attention  was taken to the interspace.  A nerve root retractor was used to reflect the right L5 nerve root medially and the interspace was incised with a #11 blade scalpel.  The disk was radically cleaned out.  Then, using a 9 mm distractor, this was inserted in the patients right side.  Then, on the patients left side, again the epidural vein was coagulated.  After a nerve root retractor was used to reflect the left L5 nerve root medially, the disk was radically cleaned out and the #8 distractor and cutter were used on this side and chisel  as well. Fluoroscopy confirmed good depth with the chisel.  The end plate was scraped off and prepared to receive the tension allograft.  An 8 x 24 mm Tension allograft was then inserted approximately 3 mm deep to the posterior vertebral line partially restoring the patients lordosis.  Attention was then taken to the patients right side.  Again, distractor was removed.  Using an 8 cutter and chisel, the remainder of the disk space was cleaned out and prepared to receive a Tension allograft.  Epstein curet was used to scrape off the central end plate and the remainder of the locally harvested allograft was packed against the Tension allograft on the left and then an 8 x 24 mm Tension allograft was inserted at the base of the right side again correcting the lordotic deformity.  After both Tension allografts were inserted, the wound was copiously irrigated and decortication was proceeded along the TPs and lateral aspect of the facet complexes bilaterally.  The remainder of the locally harvested allograft was packed laterally along the TPs and lateral facet complexes.  Then, a 40 mm rod was size selected and this was tightened down with the screws at L5.  Then, the L4 pedicle screw was compressed against L5.  Titanium nuts were torqued down and tightened.  Angled Lachman was used to explore all nerve roots and all were noted to be completely decompressed with no evidence of graft material or stenosis, or medial pedicle breech. Then, attention was taken to placement of a medium Hemovac drain. Postoperative fluoroscopy AP and laterally showed good position of the screws, rods, and the wedges, and then the Gelfoam was overlaid on top of the dura of the nerve roots.  The fascia was closed with 0-interrupted Vicryls.  The subcutaneous tissues were closed 2-0 interrupted Vicryl.  The skin was closed with a running 4-0 subcuticular.  Benzoin and Steri-Strips were applied.  The patient went to the  recovery room in stable condition.  At the end of the case, the needle, instruments, and sponge counts were correct. Dictated by:   Garlon Hatchet., M.D.  Attending Physician:  Mariam Dollar DD:  02/24/01 TD:  02/24/01 Job: [id number]

## 2011-09-04 ENCOUNTER — Encounter (HOSPITAL_COMMUNITY): Payer: Self-pay

## 2011-09-04 ENCOUNTER — Emergency Department (HOSPITAL_COMMUNITY)
Admission: EM | Admit: 2011-09-04 | Discharge: 2011-09-04 | Disposition: A | Discharge status: Home / Self Care | Payer: Medicare Other | Attending: Emergency Medicine | Admitting: Emergency Medicine

## 2011-09-04 DIAGNOSIS — F319 Bipolar disorder, unspecified: Secondary | ICD-10-CM | POA: Insufficient documentation

## 2011-09-04 DIAGNOSIS — S51809A Unspecified open wound of unspecified forearm, initial encounter: Secondary | ICD-10-CM | POA: Insufficient documentation

## 2011-09-04 DIAGNOSIS — Z23 Encounter for immunization: Secondary | ICD-10-CM | POA: Insufficient documentation

## 2011-09-04 DIAGNOSIS — S51819A Laceration without foreign body of unspecified forearm, initial encounter: Secondary | ICD-10-CM

## 2011-09-04 DIAGNOSIS — K219 Gastro-esophageal reflux disease without esophagitis: Secondary | ICD-10-CM | POA: Insufficient documentation

## 2011-09-04 DIAGNOSIS — F17200 Nicotine dependence, unspecified, uncomplicated: Secondary | ICD-10-CM | POA: Insufficient documentation

## 2011-09-04 DIAGNOSIS — W268XXA Contact with other sharp object(s), not elsewhere classified, initial encounter: Secondary | ICD-10-CM | POA: Insufficient documentation

## 2011-09-04 DIAGNOSIS — I10 Essential (primary) hypertension: Secondary | ICD-10-CM | POA: Insufficient documentation

## 2011-09-04 HISTORY — DX: Bipolar disorder, unspecified: F31.9

## 2011-09-04 HISTORY — DX: Gastro-esophageal reflux disease without esophagitis: K21.9

## 2011-09-04 HISTORY — DX: Essential (primary) hypertension: I10

## 2011-09-04 MED ORDER — TETANUS-DIPHTH-ACELL PERTUSSIS 5-2.5-18.5 LF-MCG/0.5 IM SUSP
0.5000 mL | Freq: Once | INTRAMUSCULAR | Status: AC
  Administered 2011-09-04: 0.5 mL via INTRAMUSCULAR
  Filled 2011-09-04: qty 0.5

## 2011-09-04 MED ORDER — BACITRACIN-NEOMYCIN-POLYMYXIN 400-5-5000 EX OINT
TOPICAL_OINTMENT | Freq: Once | CUTANEOUS | Status: AC
  Administered 2011-09-04: 1 via TOPICAL
  Filled 2011-09-04: qty 1

## 2011-09-04 NOTE — ED Provider Notes (Signed)
History     CSN: [id number]  Arrival date & time 09/04/11  1859   First MD Initiated Contact with Patient 09/04/11 2007      Chief Complaint  Patient presents with  . Arm Injury    (Consider location/radiation/quality/duration/timing/severity/associated sxs/prior treatment) HPI Comments: Christine Rogers presents for a tetanus shot.  Her [place] dog jumped on her arm yesterday,  Causing a skin tear to her left forearm.  She has cleaned the wound with hydrogen peroxide,  Clipped the loose skin away and has kept the area clean, dry, covered with triple antibiotic ointment and a dressing.  She reports mild pain but defers treatment of this today.  She is simply desirous of a tetanus update.  The history is provided by the patient and a relative.    Past Medical History  Diagnosis Date  . Hypertension   . GERD (gastroesophageal reflux disease)   . Bipolar 1 disorder     Past Surgical History  Procedure Date  . Abdominal hysterectomy   . Back surgery   . Tonsillectomy     History reviewed. No pertinent family history.  History  Substance Use Topics  . Smoking status: Current Some Day Smoker -- 1.0 packs/day    Types: Cigarettes  . Smokeless tobacco: Not on file  . Alcohol Use: No    OB History    Grav Para Term Preterm Abortions TAB SAB Ect Mult Living                  Review of Systems  Constitutional: Negative for fever and chills.  HENT: Negative for facial swelling.   Respiratory: Negative for shortness of breath and wheezing.   Skin: Positive for wound.  Neurological: Negative for numbness.    Allergies  Ciprofloxacin; Meperidine hcl; and Morphine  Home Medications  No current outpatient prescriptions on file.  BP 149/62  Pulse 73  Temp 98.2 F (36.8 C) (Oral)  Resp 16  Ht 5\' 1"  (1.549 m)  Wt 102 lb (46.267 kg)  BMI 19.27 kg/m2  SpO2 100%  Physical Exam  Constitutional: She is oriented to person, place, and time. She appears well-developed  and well-nourished.  HENT:  Head: Normocephalic.  Cardiovascular: Normal rate.   Pulmonary/Chest: Effort normal.  Musculoskeletal: She exhibits no edema and no tenderness.  Neurological: She is alert and oriented to person, place, and time. No sensory deficit.  Skin:       2 cm skin tear left upper dorsal forearm which is clean,  Non erythematous with no swelling,  Surrounding redness,  Drainage or other sign of infection.      ED Course  Procedures (including critical care time)  Labs Reviewed - No data to display No results found.   1. Tetanus-diphtheria (Td) vaccination   2. Skin tear of forearm without complication    Dressing reapplied to wound with bacitracin ointment.     MDM  Tetanus updated.  Pt advised to f/u with pcp or return here for any signs of infection at wound site.        Burgess Amor, [place] 09/04/11 2031

## 2011-09-04 NOTE — ED Notes (Signed)
Nad, alert, Pt was lying on sofa, Her dog jumped up  On sofa and caused skin tear to pts lt forearm.

## 2011-09-04 NOTE — ED Notes (Signed)
My dog jump on my left arm and tore the skin on my arm with its nails per pt. I need a tetanus shot per pt.

## 2011-09-05 NOTE — ED Provider Notes (Signed)
Medical screening examination/treatment/procedure(s) were performed by non-physician practitioner and as supervising physician I was immediately available for consultation/collaboration.  Donnetta Hutching, MD 09/05/11 2257

## 2014-04-10 ENCOUNTER — Emergency Department (HOSPITAL_COMMUNITY): Payer: Medicare Other

## 2014-04-10 ENCOUNTER — Emergency Department (HOSPITAL_COMMUNITY)
Admission: EM | Admit: 2014-04-10 | Discharge: 2014-04-10 | Disposition: A | Discharge status: Home / Self Care | Payer: Medicare Other | Attending: Emergency Medicine | Admitting: Emergency Medicine

## 2014-04-10 ENCOUNTER — Encounter (HOSPITAL_COMMUNITY): Payer: Self-pay | Admitting: *Deleted

## 2014-04-10 DIAGNOSIS — F319 Bipolar disorder, unspecified: Secondary | ICD-10-CM | POA: Diagnosis not present

## 2014-04-10 DIAGNOSIS — I10 Essential (primary) hypertension: Secondary | ICD-10-CM | POA: Insufficient documentation

## 2014-04-10 DIAGNOSIS — K219 Gastro-esophageal reflux disease without esophagitis: Secondary | ICD-10-CM | POA: Insufficient documentation

## 2014-04-10 DIAGNOSIS — R0789 Other chest pain: Secondary | ICD-10-CM | POA: Insufficient documentation

## 2014-04-10 DIAGNOSIS — E780 Pure hypercholesterolemia: Secondary | ICD-10-CM | POA: Diagnosis not present

## 2014-04-10 DIAGNOSIS — R079 Chest pain, unspecified: Secondary | ICD-10-CM

## 2014-04-10 DIAGNOSIS — Z720 Tobacco use: Secondary | ICD-10-CM | POA: Diagnosis not present

## 2014-04-10 DIAGNOSIS — Z79899 Other long term (current) drug therapy: Secondary | ICD-10-CM | POA: Insufficient documentation

## 2014-04-10 HISTORY — DX: Pure hypercholesterolemia, unspecified: E78.00

## 2014-04-10 LAB — CBC WITH DIFFERENTIAL/PLATELET
BASOS ABS: 0.1 10*3/uL (ref 0.0–0.1)
BASOS PCT: 1 % (ref 0–1)
EOS ABS: 0.1 10*3/uL (ref 0.0–0.7)
EOS PCT: 2 % (ref 0–5)
HCT: 40.9 % (ref 36.0–46.0)
Hemoglobin: 14.4 g/dL (ref 12.0–15.0)
Lymphocytes Relative: 30 % (ref 12–46)
Lymphs Abs: 2.2 10*3/uL (ref 0.7–4.0)
MCH: 32.5 pg (ref 26.0–34.0)
MCHC: 35.2 g/dL (ref 30.0–36.0)
MCV: 92.3 fL (ref 78.0–100.0)
MONO ABS: 0.5 10*3/uL (ref 0.1–1.0)
MONOS PCT: 7 % (ref 3–12)
NEUTROS PCT: 60 % (ref 43–77)
Neutro Abs: 4.4 10*3/uL (ref 1.7–7.7)
Platelets: 247 10*3/uL (ref 150–400)
RBC: 4.43 MIL/uL (ref 3.87–5.11)
RDW: 13 % (ref 11.5–15.5)
WBC: 7.3 10*3/uL (ref 4.0–10.5)

## 2014-04-10 LAB — COMPREHENSIVE METABOLIC PANEL
ALBUMIN: 3.6 g/dL (ref 3.5–5.2)
ALT: 9 U/L (ref 0–35)
ANION GAP: 10 (ref 5–15)
AST: 20 U/L (ref 0–37)
Alkaline Phosphatase: 116 U/L (ref 39–117)
BUN: 11 mg/dL (ref 6–23)
CALCIUM: 8.7 mg/dL (ref 8.4–10.5)
CO2: 23 mmol/L (ref 19–32)
CREATININE: 0.77 mg/dL (ref 0.50–1.10)
Chloride: 104 mmol/L (ref 96–112)
GFR calc Af Amer: >90 mL/min (ref >90)
GFR, EST NON AFRICAN AMERICAN: 83 mL/min — AB (ref >90)
GLUCOSE: 122 mg/dL — AB (ref 70–99)
POTASSIUM: 2.8 mmol/L — AB (ref 3.5–5.1)
Sodium: 137 mmol/L (ref 135–145)
Total Bilirubin: 0.5 mg/dL (ref 0.3–1.2)
Total Protein: 6.8 g/dL (ref 6.0–8.3)

## 2014-04-10 LAB — I-STAT TROPONIN, ED
TROPONIN I, POC: 0.01 ng/mL (ref 0.00–0.08)
Troponin i, poc: 0 ng/mL (ref 0.00–0.08)

## 2014-04-10 LAB — TROPONIN I

## 2014-04-10 MED ORDER — NITROGLYCERIN 2 % TD OINT
1.0000 [in_us] | TOPICAL_OINTMENT | Freq: Once | TRANSDERMAL | Status: AC
  Administered 2014-04-10: 1 [in_us] via TOPICAL
  Filled 2014-04-10: qty 1

## 2014-04-10 MED ORDER — POTASSIUM CHLORIDE CRYS ER 20 MEQ PO TBCR
40.0000 meq | EXTENDED_RELEASE_TABLET | Freq: Once | ORAL | Status: AC
  Administered 2014-04-10: 40 meq via ORAL
  Filled 2014-04-10: qty 2

## 2014-04-10 MED ORDER — HYDROCODONE-ACETAMINOPHEN 5-325 MG PO TABS
1.0000 | ORAL_TABLET | Freq: Once | ORAL | Status: AC
  Administered 2014-04-10: 1 via ORAL
  Filled 2014-04-10: qty 1

## 2014-04-10 MED ORDER — HYDROCODONE-ACETAMINOPHEN 5-325 MG PO TABS: 1.0000 | ORAL_TABLET | Freq: Four times a day (QID) | ORAL | Status: DC | PRN

## 2014-04-10 MED ORDER — ASPIRIN 325 MG PO TABS
325.0000 mg | ORAL_TABLET | Freq: Once | ORAL | Status: AC
  Administered 2014-04-10: 325 mg via ORAL
  Filled 2014-04-10: qty 1

## 2014-04-10 MED ORDER — POTASSIUM CHLORIDE CRYS ER 20 MEQ PO TBCR: 20.0000 meq | EXTENDED_RELEASE_TABLET | Freq: Every day | ORAL | Status: DC

## 2014-04-10 NOTE — ED Notes (Signed)
Pt c/o mid center chest pain that radiates to neck area, left shoulder, left scapula that started a week ago, pain is described as dull, associated with sob, denies any n/v, diaphoresis,

## 2014-04-10 NOTE — ED Notes (Signed)
Patient ambulated to restroom with assist, tolerated well 

## 2014-04-10 NOTE — ED Provider Notes (Signed)
CSN: [id number]     Arrival date & time 04/10/14  1659 History   First MD Initiated Contact with Patient 04/10/14 1733     Chief Complaint  Patient presents with  . Chest Pain     (Consider location/radiation/quality/duration/timing/severity/associated sxs/prior Treatment) Patient is a 74 y.o. female presenting with chest pain. The history is provided by the patient (the pt states she had chest pain and few days ago and had some pain today).  Chest Pain Pain location:  Substernal area Pain quality: aching   Pain radiates to:  Does not radiate Pain radiates to the back: no   Pain severity:  Mild Onset quality:  Sudden Timing:  Intermittent Progression:  Resolved Associated symptoms: no abdominal pain, no back pain, no cough, no fatigue and no headache     Past Medical History  Diagnosis Date  . Hypertension   . GERD (gastroesophageal reflux disease)   . Bipolar 1 disorder   . High cholesterol    Past Surgical History  Procedure Laterality Date  . Abdominal hysterectomy    . Back surgery    . Tonsillectomy     No family history on file. History  Substance Use Topics  . Smoking status: Current Some Day Smoker -- 1.00 packs/day    Types: Cigarettes  . Smokeless tobacco: Not on file  . Alcohol Use: No   OB History    No data available     Review of Systems  Constitutional: Negative for appetite change and fatigue.  HENT: Negative for congestion, ear discharge and sinus pressure.   Eyes: Negative for discharge.  Respiratory: Negative for cough.   Cardiovascular: Positive for chest pain.  Gastrointestinal: Negative for abdominal pain and diarrhea.  Genitourinary: Negative for frequency and hematuria.  Musculoskeletal: Negative for back pain.  Skin: Negative for rash.  Neurological: Negative for seizures and headaches.  Psychiatric/Behavioral: Negative for hallucinations.      Allergies  Imitrex; Ciprofloxacin; and Meperidine hcl  Home Medications   Prior  to Admission medications   Medication Sig Start Date End Date Taking? Authorizing Provider  amLODipine (NORVASC) 2.5 MG tablet Take 2.5 mg by mouth every evening.   Yes Historical Provider, MD  Aspirin-Salicylamide-Caffeine 325-95-16 MG TABS Take 1 packet by mouth daily as needed (FOR PAIN).   Yes Historical Provider, MD  atorvastatin (LIPITOR) 20 MG tablet Take 20 mg by mouth every morning.   Yes Historical Provider, MD  lisinopril (PRINIVIL,ZESTRIL) 2.5 MG tablet Take 2.5 mg by mouth every evening.   Yes Historical Provider, MD  oxycodone (ROXICODONE) 30 MG immediate release tablet Take 30 mg by mouth 4 (four) times daily as needed. FOR PAIN 04/08/14  Yes Historical Provider, MD  OXYCONTIN 80 MG T12A 12 hr tablet Take 80 mg by mouth every 12 (twelve) hours. 03/27/14  Yes Historical Provider, MD  pantoprazole (PROTONIX) 40 MG tablet Take 40 mg by mouth every evening.   Yes Historical Provider, MD  rOPINIRole (REQUIP) 0.5 MG tablet Take 0.5-1 mg by mouth every evening.   Yes Historical Provider, MD  venlafaxine XR (EFFEXOR-XR) 75 MG 24 hr capsule Take 75 mg by mouth every evening.   Yes Historical Provider, MD  HYDROcodone-acetaminophen (NORCO/VICODIN) 5-325 MG per tablet Take 1 tablet by mouth every 6 (six) hours as needed for moderate pain. 04/10/14   Bethann Berkshire, MD   BP 140/68 mmHg  Pulse 60  Temp(Src) 98.4 F (36.9 C) (Oral)  Resp 16  Ht  (1.549 m)  Wt 107  lb (48.535 kg)  BMI 20.23 kg/m2  SpO2 95% Physical Exam  Constitutional: She is oriented to person, place, and time. She appears well-developed.  HENT:  Head: Normocephalic.  Eyes: Conjunctivae and EOM are normal. No scleral icterus.  Neck: Neck supple. No thyromegaly present.  Cardiovascular: Normal rate and regular rhythm.  Exam reveals no gallop and no friction rub.   No murmur heard. Pulmonary/Chest: No stridor. She has no wheezes. She has no rales. She exhibits no tenderness.  Abdominal: She exhibits no distension. There  is no tenderness. There is no rebound.  Musculoskeletal: Normal range of motion. She exhibits no edema.  Lymphadenopathy:    She has no cervical adenopathy.  Neurological: She is oriented to person, place, and time. She exhibits normal muscle tone. Coordination normal.  Skin: No rash noted. No erythema.  Psychiatric: She has a normal mood and affect. Her behavior is normal.    ED Course  Procedures (including critical care time) Labs Review Labs Reviewed  COMPREHENSIVE METABOLIC PANEL - Abnormal; Notable for the following:    Potassium 2.8 (*)    Glucose, Bld 122 (*)    GFR calc non Af Amer 83 (*)    All other components within normal limits  TROPONIN I  CBC WITH DIFFERENTIAL/PLATELET  Rosezena SensorI-STAT TROPOININ, ED  Rosezena SensorI-STAT TROPOININ, ED    Imaging Review Dg Chest Port 1 View  04/10/2014   CLINICAL DATA:  Mid center chest pain for 1 week, shortness of breath. Smoker.  EXAM: PORTABLE CHEST - 1 VIEW  COMPARISON:  None.  FINDINGS: The heart size is at upper limits of normal. No focal pulmonary opacity. Diffusely prominent interstitial reticular opacities are noted. Biapical pleural thickening. The visualized skeletal structures are unremarkable.  IMPRESSION: No active disease.   Electronically Signed   By: Christiana PellantGretchen  Green M.D.   On: 04/10/2014 18:11     EKG Interpretation   Date/Time:  Sunday April 10 2014 17:05:20 EDT Ventricular Rate:  70 PR Interval:  154 QRS Duration: 84 QT Interval:  440 QTC Calculation: 475 R Axis:   16 Text Interpretation:  Normal sinus rhythm Nonspecific ST abnormality  Abnormal ECG since last tracing no significant change Confirmed by Effie ShyWENTZ   MD, ELLIOTT ([id number]) on 04/10/2014 5:22:55 PM      MDM   Final diagnoses:  Chest pain at rest    Atypical chest pain with normal troponin x2.  Will start aspirin a day and follow up with cards this week    Bethann BerkshireJoseph Jonnae Fonseca, MD 04/10/14 2130

## 2014-04-10 NOTE — Discharge Instructions (Signed)
Take one aspirin a day.  And follow up with the cardiologist this week. Call Monday for an appointment.  Return if problems

## 2014-04-10 NOTE — ED Notes (Signed)
Pt alert & oriented x4, stable gait. Patient given discharge instructions, paperwork & prescription(s). Patient informed not to drive, operate any equipment & handel any important documents 4 hours after taking pain medication. Patient  instructed to stop at the registration desk to finish any additional paperwork. Patient  verbalized understanding. Pt left department w/ no further questions. 

## 2014-04-25 ENCOUNTER — Emergency Department (HOSPITAL_COMMUNITY): Payer: Medicare Other

## 2014-04-25 ENCOUNTER — Encounter (HOSPITAL_COMMUNITY): Payer: Self-pay | Admitting: Emergency Medicine

## 2014-04-25 ENCOUNTER — Emergency Department (HOSPITAL_COMMUNITY)
Admission: EM | Admit: 2014-04-25 | Discharge: 2014-04-25 | Disposition: A | Discharge status: Home / Self Care | Payer: Medicare Other | Attending: Emergency Medicine | Admitting: Emergency Medicine

## 2014-04-25 DIAGNOSIS — R079 Chest pain, unspecified: Secondary | ICD-10-CM | POA: Diagnosis present

## 2014-04-25 DIAGNOSIS — I10 Essential (primary) hypertension: Secondary | ICD-10-CM | POA: Insufficient documentation

## 2014-04-25 DIAGNOSIS — K219 Gastro-esophageal reflux disease without esophagitis: Secondary | ICD-10-CM | POA: Diagnosis not present

## 2014-04-25 DIAGNOSIS — F319 Bipolar disorder, unspecified: Secondary | ICD-10-CM | POA: Insufficient documentation

## 2014-04-25 DIAGNOSIS — Z720 Tobacco use: Secondary | ICD-10-CM | POA: Diagnosis not present

## 2014-04-25 DIAGNOSIS — E780 Pure hypercholesterolemia: Secondary | ICD-10-CM | POA: Diagnosis not present

## 2014-04-25 DIAGNOSIS — Z79899 Other long term (current) drug therapy: Secondary | ICD-10-CM | POA: Diagnosis not present

## 2014-04-25 LAB — BASIC METABOLIC PANEL
ANION GAP: 11 (ref 5–15)
BUN: 6 mg/dL (ref 6–23)
CO2: 22 mmol/L (ref 19–32)
CREATININE: 1.09 mg/dL (ref 0.50–1.10)
Calcium: 9.5 mg/dL (ref 8.4–10.5)
Chloride: 104 mmol/L (ref 96–112)
GFR calc non Af Amer: 50 mL/min — ABNORMAL LOW (ref >90)
GFR, EST AFRICAN AMERICAN: 58 mL/min — AB (ref >90)
Glucose, Bld: 144 mg/dL — ABNORMAL HIGH (ref 70–99)
Potassium: 3.4 mmol/L — ABNORMAL LOW (ref 3.5–5.1)
Sodium: 137 mmol/L (ref 135–145)

## 2014-04-25 LAB — I-STAT TROPONIN, ED: Troponin i, poc: 0 ng/mL (ref 0.00–0.08)

## 2014-04-25 LAB — CBC WITH DIFFERENTIAL/PLATELET
BASOS ABS: 0.1 10*3/uL (ref 0.0–0.1)
Basophils Relative: 1 % (ref 0–1)
Eosinophils Absolute: 0.1 10*3/uL (ref 0.0–0.7)
Eosinophils Relative: 1 % (ref 0–5)
HEMATOCRIT: 44.8 % (ref 36.0–46.0)
Hemoglobin: 15.3 g/dL — ABNORMAL HIGH (ref 12.0–15.0)
LYMPHS PCT: 28 % (ref 12–46)
Lymphs Abs: 2.5 10*3/uL (ref 0.7–4.0)
MCH: 32.1 pg (ref 26.0–34.0)
MCHC: 34.2 g/dL (ref 30.0–36.0)
MCV: 93.9 fL (ref 78.0–100.0)
Monocytes Absolute: 0.5 10*3/uL (ref 0.1–1.0)
Monocytes Relative: 6 % (ref 3–12)
NEUTROS ABS: 5.9 10*3/uL (ref 1.7–7.7)
Neutrophils Relative %: 64 % (ref 43–77)
PLATELETS: 336 10*3/uL (ref 150–400)
RBC: 4.77 MIL/uL (ref 3.87–5.11)
RDW: 13.5 % (ref 11.5–15.5)
WBC: 9.1 10*3/uL (ref 4.0–10.5)

## 2014-04-25 LAB — BRAIN NATRIURETIC PEPTIDE: B NATRIURETIC PEPTIDE 5: 9.8 pg/mL (ref 0.0–100.0)

## 2014-04-25 MED ORDER — RANITIDINE HCL 150 MG PO TABS: 150.0000 mg | ORAL_TABLET | Freq: Two times a day (BID) | ORAL | Status: DC

## 2014-04-25 MED ORDER — SUCRALFATE 1 G PO TABS: 1.0000 g | ORAL_TABLET | Freq: Three times a day (TID) | ORAL | Status: DC

## 2014-04-25 NOTE — Discharge Instructions (Signed)

## 2014-04-25 NOTE — ED Notes (Signed)
Pt c/o mid sternal CP into epigastric area

## 2014-04-25 NOTE — ED Notes (Signed)
Pt states she has had chest pain, sob and nausea for the past few weeks, has been taking protonix, maalox, prilosec since she thought it was from acid reflux, [facility] from this past Sunday and d/c but was unsure what her diagnosis was. Pt alert, oriented, nad.

## 2014-04-25 NOTE — ED Provider Notes (Signed)
CSN: [id number]     Arrival date & time 04/25/14  1512 History   First MD Initiated Contact with Patient 04/25/14 1834     Chief Complaint  Patient presents with  . Chest Pain     (Consider location/radiation/quality/duration/timing/severity/associated sxs/prior Treatment) HPI Comments: The patient is a 74 year old female, she has a history of high blood pressure, high cholesterol and smokes cigarettes. She reports about 3 weeks of ongoing midsternal chest discomfort and epigastric discomfort which radiates up into her neck, seems to get worse at night, is not exertional or associated with deep breathing or position. She has been taking Maalox, Prilosec and Protonix with some improvement. She noticed today that after she belched a large amount of gas she felt much much better. She denies swelling of the lower extremities, has no history of exertional chest pain or dyspnea and has not been having any fevers chills or lower extremity swelling or pain in the legs. She does have nausea associated with the chest pain. She denies any radiation to the back with the shoulders. She has not been seen by cardiologist, she has never had a stress test, she does take her medications at home  Patient is a 74 y.o. female presenting with chest pain. The history is provided by the patient.  Chest Pain   Past Medical History  Diagnosis Date  . Hypertension   . GERD (gastroesophageal reflux disease)   . Bipolar 1 disorder   . High cholesterol    Past Surgical History  Procedure Laterality Date  . Abdominal hysterectomy    . Back surgery    . Tonsillectomy     History reviewed. No pertinent family history. History  Substance Use Topics  . Smoking status: Current Some Day Smoker -- 1.00 packs/day    Types: Cigarettes  . Smokeless tobacco: Not on file  . Alcohol Use: No   OB History    No data available     Review of Systems  Cardiovascular: Positive for chest pain.  All other systems reviewed  and are negative.     Allergies  Imitrex; Ciprofloxacin; and Meperidine hcl  Home Medications   Prior to Admission medications   Medication Sig Start Date End Date Taking? Authorizing Provider  amLODipine (NORVASC) 2.5 MG tablet Take 2.5 mg by mouth every evening.    Historical Provider, MD  Aspirin-Salicylamide-Caffeine 325-95-16 MG TABS Take 1 packet by mouth daily as needed (FOR PAIN).    Historical Provider, MD  atorvastatin (LIPITOR) 20 MG tablet Take 20 mg by mouth every morning.    Historical Provider, MD  HYDROcodone-acetaminophen (NORCO/VICODIN) 5-325 MG per tablet Take 1 tablet by mouth every 6 (six) hours as needed for moderate pain. 04/10/14   Bethann BerkshireJoseph Zammit, MD  lisinopril (PRINIVIL,ZESTRIL) 2.5 MG tablet Take 2.5 mg by mouth every evening.    Historical Provider, MD  oxycodone (ROXICODONE) 30 MG immediate release tablet Take 30 mg by mouth 4 (four) times daily as needed. FOR PAIN 04/08/14   Historical Provider, MD  OXYCONTIN 80 MG T12A 12 hr tablet Take 80 mg by mouth every 12 (twelve) hours. 03/27/14   Historical Provider, MD  pantoprazole (PROTONIX) 40 MG tablet Take 40 mg by mouth every evening.    Historical Provider, MD  potassium chloride SA (K-DUR,KLOR-CON) 20 MEQ tablet Take 1 tablet (20 mEq total) by mouth daily. 04/10/14   Bethann BerkshireJoseph Zammit, MD  ranitidine (ZANTAC) 150 MG tablet Take 1 tablet (150 mg total) by mouth 2 (two) times daily. 04/25/14  Eber Hong, MD  rOPINIRole (REQUIP) 0.5 MG tablet Take 0.5-1 mg by mouth every evening.    Historical Provider, MD  sucralfate (CARAFATE) 1 G tablet Take 1 tablet (1 g total) by mouth 4 (four) times daily -  with meals and at bedtime. 04/25/14   Eber Hong, MD  venlafaxine XR (EFFEXOR-XR) 75 MG 24 hr capsule Take 75 mg by mouth every evening.    Historical Provider, MD   BP 144/75 mmHg  Pulse 73  Temp(Src) 97.4 F (36.3 C) (Oral)  Resp 18  SpO2 97% Physical Exam  Constitutional: She appears well-developed and well-nourished.  No distress.  HENT:  Head: Normocephalic and atraumatic.  Mouth/Throat: Oropharynx is clear and moist. No oropharyngeal exudate.  Eyes: Conjunctivae and EOM are normal. Pupils are equal, round, and reactive to light. Right eye exhibits no discharge. Left eye exhibits no discharge. No scleral icterus.  Neck: Normal range of motion. Neck supple. No JVD present. No thyromegaly present.  Cardiovascular: Normal rate, regular rhythm, normal heart sounds and intact distal pulses.  Exam reveals no gallop and no friction rub.   No murmur heard. Pulmonary/Chest: Effort normal and breath sounds normal. No respiratory distress. She has no wheezes. She has no rales.  Abdominal: Soft. Bowel sounds are normal. She exhibits no distension and no mass. There is no tenderness.  Musculoskeletal: Normal range of motion. She exhibits no edema or tenderness.  Lymphadenopathy:    She has no cervical adenopathy.  Neurological: She is alert. Coordination normal.  Skin: Skin is warm and dry. No rash noted. No erythema.  Psychiatric: She has a normal mood and affect. Her behavior is normal.  Nursing note and vitals reviewed.   ED Course  Procedures (including critical care time) Labs Review Labs Reviewed  BASIC METABOLIC PANEL - Abnormal; Notable for the following:    Potassium 3.4 (*)    Glucose, Bld 144 (*)    GFR calc non Af Amer 50 (*)    GFR calc Af Amer 58 (*)    All other components within normal limits  CBC WITH DIFFERENTIAL/PLATELET - Abnormal; Notable for the following:    Hemoglobin 15.3 (*)    All other components within normal limits  BRAIN NATRIURETIC PEPTIDE  I-STAT TROPOININ, ED    Imaging Review Dg Chest 2 View  04/25/2014   CLINICAL DATA:  Lower chest pain and nausea for past 3 days, smoker  EXAM: CHEST  2 VIEW  COMPARISON:  04/10/2014  FINDINGS: Normal heart size and pulmonary vascularity.  Tortuosity of thoracic aorta with mild atherosclerotic calcification at arch.  Emphysematous and  bronchitic changes consistent with COPD.  Biapical scarring greater on RIGHT.  No definite infiltrate, pleural effusion, or pneumothorax.  Questionable nodular density RIGHT upper lobe near apex 15 mm greatest size, increased versus previous exam.  Bowel interposition between liver and diaphragm.  Bones demineralized.  IMPRESSION: COPD changes with biapical scarring and of questionable RIGHT upper lobe nodular density versus focus of scarring, appears more prominent than on previous exam ; CT chest recommended to exclude pulmonary nodule.   Electronically Signed   By: Ulyses Southward M.D.   On: 04/25/2014 17:21     EKG Interpretation   Date/Time:  Monday April 25 2014 15:21:32 EDT Ventricular Rate:  86 PR Interval:  144 QRS Duration: 74 QT Interval:  390 QTC Calculation: 466 R Axis:   18 Text Interpretation:  Normal sinus rhythm Possible Left atrial enlargement  Nonspecific ST and T wave abnormality Abnormal  ECG When compared with ECG  of 04/10/2014, No significant change was found Confirmed by [facility]  MD, DAVID  ([id number]) on 04/25/2014 3:26:37 PM      MDM   Final diagnoses:  Chest pain, unspecified chest pain type    The patient has a normal physical exam, her lungs and heart are both normal, normal rhythm, EKG is unremarkable, chest x-ray with a nodule but no other acute findings, labs normal including troponin. After 3 weeks of constant chest pain I doubt that this is cardiac in nature especially given the positional and nighttime exacerbation of the pain. I will start her on additional Zantac in addition to her proton pump inhibitor and Carafate, I will also discuss with cardiology to assist in setting up outpatient stress test given her significant risk factors and age. The patient is in agreement with the plan.  Discussed with Dr. Dorothyann Peng who will follow-up and scheduled the patient for cardiology clinic follow-up closely.  Troponin negative, chest x-ray with nodule, patient informed,  can follow up outpatient.  Meds given in ED:  Medications - No data to display  New Prescriptions   RANITIDINE (ZANTAC) 150 MG TABLET    Take 1 tablet (150 mg total) by mouth 2 (two) times daily.   SUCRALFATE (CARAFATE) 1 G TABLET    Take 1 tablet (1 g total) by mouth 4 (four) times daily -  with meals and at bedtime.        Eber Hong, MD 04/25/14 2032

## 2014-05-18 ENCOUNTER — Encounter (INDEPENDENT_AMBULATORY_CARE_PROVIDER_SITE_OTHER): Payer: Self-pay | Admitting: *Deleted

## 2014-06-09 ENCOUNTER — Ambulatory Visit (INDEPENDENT_AMBULATORY_CARE_PROVIDER_SITE_OTHER): Payer: Medicare Other | Admitting: Internal Medicine

## 2014-07-05 ENCOUNTER — Ambulatory Visit (INDEPENDENT_AMBULATORY_CARE_PROVIDER_SITE_OTHER): Payer: Medicare Other | Admitting: Internal Medicine

## 2014-09-07 ENCOUNTER — Encounter (INDEPENDENT_AMBULATORY_CARE_PROVIDER_SITE_OTHER): Payer: Self-pay | Admitting: *Deleted

## 2014-12-13 ENCOUNTER — Encounter: Payer: Self-pay | Admitting: Internal Medicine

## 2014-12-27 ENCOUNTER — Ambulatory Visit (INDEPENDENT_AMBULATORY_CARE_PROVIDER_SITE_OTHER): Payer: Medicare Other | Admitting: Gastroenterology

## 2014-12-27 ENCOUNTER — Encounter: Payer: Self-pay | Admitting: Gastroenterology

## 2014-12-27 ENCOUNTER — Other Ambulatory Visit: Payer: Self-pay

## 2014-12-27 VITALS — BP 144/85 | HR 76 | Temp 97.8°F | Ht 61.0 in | Wt 116.0 lb

## 2014-12-27 DIAGNOSIS — K219 Gastro-esophageal reflux disease without esophagitis: Secondary | ICD-10-CM | POA: Insufficient documentation

## 2014-12-27 DIAGNOSIS — R112 Nausea with vomiting, unspecified: Secondary | ICD-10-CM | POA: Diagnosis not present

## 2014-12-27 DIAGNOSIS — K5900 Constipation, unspecified: Secondary | ICD-10-CM | POA: Diagnosis not present

## 2014-12-27 DIAGNOSIS — R635 Abnormal weight gain: Secondary | ICD-10-CM | POA: Insufficient documentation

## 2014-12-27 MED ORDER — LINACLOTIDE 145 MCG PO CAPS: 145.0000 ug | ORAL_CAPSULE | Freq: Every day | ORAL | Status: DC

## 2014-12-27 NOTE — Progress Notes (Signed)
Please let patient know that I reviewed more records from 04/2014 ER visit. She had CXR that showed some abnormality in right upper lobe and they advised for chest CT. She needs to follow up with PCP for this. Send copy of CXR from 04/25/14 to PCP.

## 2014-12-27 NOTE — Progress Notes (Signed)
cc'ed to pcp °

## 2014-12-27 NOTE — Assessment & Plan Note (Signed)
Chronic nausea with intermittent vomiting, left upper quadrant pain in the setting of BC powder every 2-3 days, chronic PPI therapy. To ER evaluations for atypical chest pain. Unclear whether this is related to GERD, gastritis, peptic ulcer disease. Interestingly patient states she has gained 20 pounds in the past 6 months from baseline of 100 pounds. No evidence of fluid overload on exam.  Recommend upper endoscopy for further evaluation. Given chronic narcotic therapy, deep sedation in the OR planned.  I have discussed the risks, alternatives, benefits with regards to but not limited to the risk of reaction to medication, bleeding, infection, perforation and the patient is agreeable to proceed. Written consent to be obtained.  Recommend that she drop back on pantoprazole to twice a day. Further recommendations following EGD findings.

## 2014-12-27 NOTE — Progress Notes (Signed)
Tried to call pt- NA 

## 2014-12-27 NOTE — Patient Instructions (Signed)
1. Start Linzess once daily on empty stomach for constipation. 2. It is not safe to take more than TWO pantoprazole daily. Please cut back to twice daily only. 3. Upper endoscopy as scheduled. See separate instructions.

## 2014-12-27 NOTE — Progress Notes (Signed)
Primary Care Physician:  Samuel JesterYNTHIA BUTLER, DO  Primary Gastroenterologist:  Roetta SessionsMichael Rourk, MD   Chief Complaint  Patient presents with  . Gastroesophageal Reflux    HPI:  Christine DunkBarbara H Rogers is a 74 y.o. female here for further evaluation of GERD symptoms at the request of PCP.  Patient states she's been sick for at least 9 months. She's had 2 ER visits earlier this year for chest pain felt to be related to GERD. Complains of nausea and vomiting may occur for several days in a row when then go a week or 2 without any symptoms. Constant nausea. Some left upper quadrant pain. Denies typical heartburn. States she never really had that. She's been on pantoprazole for years. Initially diagnosed with GERD due to atypical chest pain years ago. Twenty pound weigth gain in six month. She can't figure out why. States she is eating 1 meal a day. She's been so nauseated. This is the most she is overweight. Typically weighs around 100 pounds. Recently she increased her pantoprazole to 3 times a day on her own. She is also taking Zofran 3 times a day.  Chronically constipated, usually has a bowel movement every 2-3 days. Last bowel movement at this point was 1 week ago. Bright red blood per rectum yesterday, no bowel movement just felt moist and went to the restroom. No melena. No prior EGD or colonoscopy. Takes a BC powder about every 2-3 days for headache. Complains of sore mouth, dry mouth. Currently not interested in a colonoscopy until current symptoms are improved.    Wants rx medication for constipation, cheaper than OTC.   Previous cardiac catheterization, remotely. No cardiac stents.  Current Outpatient Prescriptions  Medication Sig Dispense Refill  . amLODipine (NORVASC) 2.5 MG tablet Take 2.5 mg by mouth every evening.    Marland Kitchen. lisinopril (PRINIVIL,ZESTRIL) 2.5 MG tablet Take 2.5 mg by mouth every evening.    . ondansetron (ZOFRAN) 4 MG tablet Take 4 mg by mouth every 8 (eight) hours as needed.     Marland Kitchen.  oxycodone (ROXICODONE) 30 MG immediate release tablet Take 30 mg by mouth 4 (four) times daily as needed. FOR PAIN    . OXYCONTIN 80 MG T12A 12 hr tablet Take 80 mg by mouth every 12 (twelve) hours.    . pantoprazole (PROTONIX) 40 MG tablet Take 40 mg by mouth every evening. Patient says she increased to tid on her own.    . venlafaxine XR (EFFEXOR-XR) 75 MG 24 hr capsule Take 75 mg by mouth every evening.     No current facility-administered medications for this visit.    Allergies as of 12/27/2014 - Review Complete 12/27/2014  Allergen Reaction Noted  . Imitrex [sumatriptan] Anaphylaxis and Swelling 04/10/2014  . Ciprofloxacin    . Meperidine hcl Nausea And Vomiting     Past Medical History  Diagnosis Date  . Hypertension   . GERD (gastroesophageal reflux disease)   . Bipolar 1 disorder (HCC)   . High cholesterol   . DDD (degenerative disc disease), lumbar     Past Surgical History  Procedure Laterality Date  . Abdominal hysterectomy    . Back surgery    . Tonsillectomy      Family History  Problem Relation Age of Onset  . Heart disease Father   . Colon cancer Neg Hx     Social History   Social History  . Marital Status: Divorced    Spouse Name: N/A  . Number of Children: N/A  .  Years of Education: N/A   Occupational History  . Not on file.   Social History Main Topics  . Smoking status: Current Some Day Smoker -- 1.00 packs/day    Types: Cigarettes  . Smokeless tobacco: Not on file  . Alcohol Use: No  . Drug Use: No  . Sexual Activity: Not on file   Other Topics Concern  . Not on file   Social History Narrative      ROS:  General: Negative for anorexia, weight loss, fever, chills, fatigue, weakness. Eyes: Negative for vision changes.  ENT: Negative for hoarseness, difficulty swallowing , nasal congestion. CV: Negative for chest pain, angina, palpitations, dyspnea on exertion, peripheral edema.  Respiratory: Negative for dyspnea at rest, dyspnea  on exertion, cough, sputum, wheezing.  GI: See history of present illness. GU:  Negative for dysuria, hematuria, urinary incontinence, urinary frequency, nocturnal urination.  MS: Negative for joint pain, low back pain.  Derm: Negative for rash or itching.  Neuro: Negative for weakness, abnormal sensation, seizure, frequent headaches, memory loss, confusion.  Psych: Negative for anxiety, depression, suicidal ideation, hallucinations.  Endo: see hpi  Heme: Negative for bruising or bleeding. Allergy: Negative for rash or hives.    Physical Examination:  BP 144/85 mmHg  Pulse 76  Temp(Src) 97.8 F (36.6 C)  Ht  (1.549 m)  Wt 116 lb (52.617 kg)  BMI 21.93 kg/m2   General: Well-nourished, well-developed in no acute distress.  Head: Normocephalic, atraumatic.   Eyes: Conjunctiva pink, no icterus. Mouth: Oropharyngeal mucosa moist and pink , no lesions erythema or exudate. Neck: Supple without thyromegaly, masses, or lymphadenopathy.  Lungs: Clear to auscultation bilaterally.  Heart: Regular rate and rhythm, no murmurs rubs or gallops.  Abdomen: Bowel sounds are normal, nontender, nondistended, no hepatosplenomegaly or masses, no abdominal bruits or    hernia , no rebound or guarding.   Rectal: not performed Extremities: No lower extremity edema. No clubbing or deformities.  Neuro: Alert and oriented x 4 , grossly normal neurologically.  Skin: Warm and dry, no rash or jaundice.   Psych: Alert and cooperative, normal mood and affect.  Labs: Lab Results  Component Value Date   WBC 9.1 04/25/2014   HGB 15.3* 04/25/2014   HCT 44.8 04/25/2014   MCV 93.9 04/25/2014   PLT 336 04/25/2014   Lab Results  Component Value Date   CREATININE 1.09 04/25/2014   BUN 6 04/25/2014   NA 137 04/25/2014   K 3.4* 04/25/2014   CL 104 04/25/2014   CO2 22 04/25/2014   Lab Results  Component Value Date   ALT 9 04/10/2014   AST 20 04/10/2014   ALKPHOS 116 04/10/2014   BILITOT 0.5  04/10/2014     Imaging Studies: No results found.

## 2014-12-27 NOTE — Assessment & Plan Note (Signed)
Likely worsened by chronic narcotics. Trial of Linzess daily initially. Offer patient colonoscopy for history of worsening constipation, bright red blood per rectum that she is not interested at this time.

## 2014-12-29 NOTE — Progress Notes (Signed)
Tried to call- NA 

## 2015-01-04 NOTE — Progress Notes (Signed)
Letter mailed to the pt. 

## 2015-01-18 NOTE — Patient Instructions (Signed)
Christine Rogers  01/18/2015     @PREFPERIOPPHARMACY @   Your procedure is scheduled on  01/23/2015  Report to [facility] at  615  A.M.  Call this number if you have problems the morning of surgery:  [phone]   Remember:  Do not eat food or drink liquids after midnight.  Take these medicines the morning of surgery with A SIP OF WATER  Norvasc, lisinopril, zofran, oxycodone or oxycontin, protonix, effexor.   Do not wear jewelry, make-up or nail polish.  Do not wear lotions, powders, or perfumes.  You may wear deodorant.  Do not shave 48 hours prior to surgery.  Men may shave face and neck.  Do not bring valuables to the hospital.  [facility] is not responsible for any belongings or valuables.  Contacts, dentures or bridgework may not be worn into surgery.  Leave your suitcase in the car.  After surgery it may be brought to your room.  For patients admitted to the hospital, discharge time will be determined by your treatment team.  Patients discharged the day of surgery will not be allowed to drive home.   Name and phone number of your driver:   family Special instructions:  Follow the diet instructions given to you by Dr Luvenia Starch office.  Please read over the following fact sheets that you were given. Pain Booklet, Coughing and Deep Breathing, Surgical Site Infection Prevention, Anesthesia Post-op Instructions and Care and Recovery After Surgery      Esophagogastroduodenoscopy Esophagogastroduodenoscopy (EGD) is a procedure that is used to examine the lining of the esophagus, stomach, and first part of the small intestine (duodenum). A long, flexible, lighted tube with a camera attached (endoscope) is inserted down the throat to view these organs. This procedure is done to detect problems or abnormalities, such as inflammation, bleeding, ulcers, or growths, in order to treat them. The procedure lasts 5-20 minutes. It is usually an outpatient procedure, but it may  need to be performed in a hospital in emergency cases. LET [facility] CARE PROVIDER KNOW ABOUT:  Any allergies you have.  All medicines you are taking, including vitamins, herbs, eye drops, creams, and over-the-counter medicines.  Previous problems you or members of your family have had with the use of anesthetics.  Any blood disorders you have.  Previous surgeries you have had.  Medical conditions you have. RISKS AND COMPLICATIONS Generally, this is a safe procedure. However, problems can occur and include:  Infection.  Bleeding.  Tearing (perforation) of the esophagus, stomach, or duodenum.  Difficulty breathing or not being able to breathe.  Excessive sweating.  Spasms of the larynx.  Slowed heartbeat.  Low blood pressure. BEFORE THE PROCEDURE  Do not eat or drink anything after midnight on the night before the procedure or as directed by your health care provider.  Do not take your regular medicines before the procedure if your health care provider asks you not to. Ask your health care provider about changing or stopping those medicines.  If you wear dentures, be prepared to remove them before the procedure.  Arrange for someone to drive you home after the procedure. PROCEDURE  A numbing medicine (local anesthetic) may be sprayed in your throat for comfort and to stop you from gagging or coughing.  You will have an IV tube inserted in a vein in your hand or arm. You will receive medicines and fluids through this tube.  You will be given a  medicine to relax you (sedative).  A pain reliever will be given through the IV tube.  A mouth guard may be placed in your mouth to protect your teeth and to keep you from biting on the endoscope.  You will be asked to lie on your left side.  The endoscope will be inserted down your throat and into your esophagus, stomach, and duodenum.  Air will be put through the endoscope to allow your health care provider to clearly  view the lining of your esophagus.  The lining of your esophagus, stomach, and duodenum will be examined. During the exam, your health care provider may:  Remove tissue to be examined under a microscope (biopsy) for inflammation, infection, or other medical problems.  Remove growths.  Remove objects (foreign bodies) that are stuck.  Treat any bleeding with medicines or other devices that stop tissues from bleeding (hot cautery, clipping devices).  Widen (dilate) or stretch narrowed areas of your esophagus and stomach.  The endoscope will be withdrawn. AFTER THE PROCEDURE  You will be taken to a recovery area for observation. Your blood pressure, heart rate, breathing rate, and blood oxygen level will be monitored often until the medicines you were given have worn off.  Do not eat or drink anything until the numbing medicine has worn off and your gag reflex has returned. You may choke.  Your health care provider should be able to discuss his or her findings with you. It will take longer to discuss the test results if any biopsies were taken.   This information is not intended to replace advice given to you by your health care provider. Make sure you discuss any questions you have with your health care provider.   Document Released: 04/26/2004 Document Revised: 01/14/2014 Document Reviewed: 11/27/2011 Elsevier Interactive Patient Education 2016 Elsevier Inc. Esophagogastroduodenoscopy, Care After Refer to this sheet in the next few weeks. These instructions provide you with information about caring for yourself after your procedure. Your health care provider may also give you more specific instructions. Your treatment has been planned according to current medical practices, but problems sometimes occur. Call your health care provider if you have any problems or questions after your procedure. WHAT TO EXPECT AFTER THE PROCEDURE After your procedure, it is typical to feel:  Soreness in  your throat.  Pain with swallowing.  Sick to your stomach (nauseous).  Bloated.  Dizzy.  Fatigued. HOME CARE INSTRUCTIONS  Do not eat or drink anything until the numbing medicine (local anesthetic) has worn off and your gag reflex has returned. You will know that the local anesthetic has worn off when you can swallow comfortably.  Do not drive or operate machinery until directed by your health care provider.  Take medicines only as directed by your health care provider. SEEK MEDICAL CARE IF:   You cannot stop coughing.  You are not urinating at all or less than usual. SEEK IMMEDIATE MEDICAL CARE IF:  You have difficulty swallowing.  You cannot eat or drink.  You have worsening throat or chest pain.  You have dizziness or lightheadedness or you faint.  You have nausea or vomiting.  You have chills.  You have a fever.  You have severe abdominal pain.  You have black, tarry, or bloody stools.   This information is not intended to replace advice given to you by your health care provider. Make sure you discuss any questions you have with your health care provider.   Document Released: 12/11/2011 Document Revised:  01/14/2014 Document Reviewed: 12/11/2011 Elsevier Interactive Patient Education 2016 Elsevier Inc. PATIENT INSTRUCTIONS POST-ANESTHESIA  IMMEDIATELY FOLLOWING SURGERY:  Do not drive or operate machinery for the first twenty four hours after surgery.  Do not make any important decisions for twenty four hours after surgery or while taking narcotic pain medications or sedatives.  If you develop intractable nausea and vomiting or a severe headache please notify your doctor immediately.  FOLLOW-UP:  Please make an appointment with your surgeon as instructed. You do not need to follow up with anesthesia unless specifically instructed to do so.  WOUND CARE INSTRUCTIONS (if applicable):  Keep a dry clean dressing on the anesthesia/puncture wound site if there is  drainage.  Once the wound has quit draining you may leave it open to air.  Generally you should leave the bandage intact for twenty four hours unless there is drainage.  If the epidural site drains for more than 36-48 hours please call the anesthesia department.  QUESTIONS?:  Please feel free to call your physician or the hospital operator if you have any questions, and they will be happy to assist you.

## 2015-01-19 ENCOUNTER — Emergency Department (HOSPITAL_COMMUNITY)
Admission: EM | Admit: 2015-01-19 | Discharge: 2015-01-19 | Disposition: A | Discharge status: Home / Self Care | Payer: Medicare Other | Attending: Emergency Medicine | Admitting: Emergency Medicine

## 2015-01-19 ENCOUNTER — Encounter (HOSPITAL_COMMUNITY): Payer: Self-pay | Admitting: Emergency Medicine

## 2015-01-19 DIAGNOSIS — F319 Bipolar disorder, unspecified: Secondary | ICD-10-CM | POA: Insufficient documentation

## 2015-01-19 DIAGNOSIS — I10 Essential (primary) hypertension: Secondary | ICD-10-CM

## 2015-01-19 DIAGNOSIS — Z8639 Personal history of other endocrine, nutritional and metabolic disease: Secondary | ICD-10-CM | POA: Insufficient documentation

## 2015-01-19 DIAGNOSIS — Z79899 Other long term (current) drug therapy: Secondary | ICD-10-CM | POA: Diagnosis not present

## 2015-01-19 DIAGNOSIS — K219 Gastro-esophageal reflux disease without esophagitis: Secondary | ICD-10-CM | POA: Diagnosis not present

## 2015-01-19 DIAGNOSIS — F17210 Nicotine dependence, cigarettes, uncomplicated: Secondary | ICD-10-CM | POA: Insufficient documentation

## 2015-01-19 DIAGNOSIS — Z8739 Personal history of other diseases of the musculoskeletal system and connective tissue: Secondary | ICD-10-CM | POA: Insufficient documentation

## 2015-01-19 NOTE — ED Provider Notes (Signed)
CSN: [id number]     Arrival date & time 01/19/15  1858 History   First MD Initiated Contact with Patient 01/19/15 2042     Chief Complaint  Patient presents with  . Hypertension     HPI Patient presents to the emergency department with complaints of elevated blood pressure.  She was seen in her primary care doctor's office yesterday and her blood pressure was noted to be high.  This began to worry her.  She's been compliant with her lisinopril and Norvasc.  She reports no chest pain shortness of breath.  She denies headache.  She denies weakness of her arms or legs.  She states for the past 9 months she's been extremely stressed out.  She stayed nauseated.  She has had ongoing pain in her mouth and her doctors are working her up for possible mouth cancer.  She said this has her worried.  She has no other complaints at this time.  She checked her blood pressure at home when it was more elevated than it was in the office yesterday and this concerned her and thus she came to the ER for evaluation.   Past Medical History  Diagnosis Date  . Hypertension   . GERD (gastroesophageal reflux disease)   . Bipolar 1 disorder (HCC)   . High cholesterol   . DDD (degenerative disc disease), lumbar    Past Surgical History  Procedure Laterality Date  . Abdominal hysterectomy    . Back surgery    . Tonsillectomy     Family History  Problem Relation Age of Onset  . Heart disease Father   . Colon cancer Neg Hx    Social History  Substance Use Topics  . Smoking status: Current Some Day Smoker -- 1.00 packs/day    Types: Cigarettes  . Smokeless tobacco: None  . Alcohol Use: No   OB History    No data available     Review of Systems  All other systems reviewed and are negative.     Allergies  Imitrex; Ciprofloxacin; and Meperidine hcl  Home Medications   Prior to Admission medications   Medication Sig Start Date End Date Taking? Authorizing Provider  amLODipine (NORVASC) 2.5 MG  tablet Take 2.5 mg by mouth every evening.    Historical Provider, MD  Linaclotide Karlene Einstein) 145 MCG CAPS capsule Take 1 capsule (145 mcg total) by mouth daily. On empty stomach 12/27/14   Tiffany Kocher, PA-C  lisinopril (PRINIVIL,ZESTRIL) 2.5 MG tablet Take 2.5 mg by mouth every evening.    Historical Provider, MD  ondansetron (ZOFRAN) 4 MG tablet Take 4 mg by mouth every 8 (eight) hours as needed.  12/24/14   Historical Provider, MD  oxycodone (ROXICODONE) 30 MG immediate release tablet Take 30 mg by mouth 4 (four) times daily as needed. FOR PAIN 04/08/14   Historical Provider, MD  OXYCONTIN 80 MG T12A 12 hr tablet Take 80 mg by mouth every 12 (twelve) hours. 03/27/14   Historical Provider, MD  pantoprazole (PROTONIX) 40 MG tablet Take 40 mg by mouth every evening. Patient says she increased to tid on her own.    Historical Provider, MD  venlafaxine XR (EFFEXOR-XR) 75 MG 24 hr capsule Take 75 mg by mouth every evening.    Historical Provider, MD   BP 189/111 mmHg  Pulse 89  Temp(Src) 98.3 F (36.8 C) (Temporal)  Resp 18  Ht 5\' 1"  (1.549 m)  Wt 117 lb (53.071 kg)  BMI 22.12 kg/m2  SpO2  98% Physical Exam  Constitutional: She is oriented to person, place, and time. She appears well-developed and well-nourished. No distress.  HENT:  Head: Normocephalic and atraumatic.  Eyes: EOM are normal.  Neck: Normal range of motion.  Cardiovascular: Normal rate, regular rhythm and normal heart sounds.   Pulmonary/Chest: Effort normal and breath sounds normal.  Abdominal: Soft. She exhibits no distension. There is no tenderness.  Musculoskeletal: Normal range of motion.  Neurological: She is alert and oriented to person, place, and time.  Skin: Skin is warm and dry.  Psychiatric: She has a normal mood and affect. Judgment normal.  Nursing note and vitals reviewed.   ED Course  Procedures (including critical care time) Labs Review Labs Reviewed - No data to display  Imaging Review No results  found. I have personally reviewed and evaluated these images and lab results as part of my medical decision-making.   EKG Interpretation None      MDM   Final diagnoses:  Essential hypertension    Patient is overall well-appearing.  Asymptomatic hypertension.  I recommended significant dietary changes and daily exercise in an effort to control her own blood pressure.  I recommended that she speak more with her primary care physician about ongoing blood pressure needs.  I recommended unless she feel poorly at home to not check her blood pressure.    Azalia BilisKevin Leannah Guse, MD 01/19/15 2117

## 2015-01-19 NOTE — ED Notes (Signed)
Pt seen at pcp yesterday, BP elevated, not changes made to medication.  Pt usually takes meds at night, took them at 4;30pm and BP continues to go up. No other symptoms at present.

## 2015-01-19 NOTE — Discharge Instructions (Signed)
DASH Eating Plan  DASH stands for "Dietary Approaches to Stop Hypertension." The DASH eating plan is a healthy eating plan that has been shown to reduce high blood pressure (hypertension). Additional health benefits may include reducing the risk of type 2 diabetes mellitus, heart disease, and stroke. The DASH eating plan may also help with weight loss.  WHAT DO I NEED TO KNOW ABOUT THE DASH EATING PLAN?  For the DASH eating plan, you will follow these general guidelines:  · Choose foods with a percent daily value for sodium of less than 5% (as listed on the food label).  · Use salt-free seasonings or herbs instead of table salt or sea salt.  · Check with your health care provider or pharmacist before using salt substitutes.  · Eat lower-sodium products, often labeled as "lower sodium" or "no salt added."  · Eat fresh foods.  · Eat more vegetables, fruits, and low-fat dairy products.  · Choose whole grains. Look for the word "whole" as the first word in the ingredient list.  · Choose fish and skinless chicken or turkey more often than red meat. Limit fish, poultry, and meat to 6 oz (170 g) each day.  · Limit sweets, desserts, sugars, and sugary drinks.  · Choose heart-healthy fats.  · Limit cheese to 1 oz (28 g) per day.  · Eat more home-cooked food and less restaurant, buffet, and fast food.  · Limit fried foods.  · Cook foods using methods other than frying.  · Limit canned vegetables. If you do use them, rinse them well to decrease the sodium.  · When eating at a restaurant, ask that your food be prepared with less salt, or no salt if possible.  WHAT FOODS CAN I EAT?  Seek help from a dietitian for individual calorie needs.  Grains  Whole grain or whole wheat bread. Brown rice. Whole grain or whole wheat pasta. Quinoa, bulgur, and whole grain cereals. Low-sodium cereals. Corn or whole wheat flour tortillas. Whole grain cornbread. Whole grain crackers. Low-sodium crackers.  Vegetables  Fresh or frozen vegetables  (raw, steamed, roasted, or grilled). Low-sodium or reduced-sodium tomato and vegetable juices. Low-sodium or reduced-sodium tomato sauce and paste. Low-sodium or reduced-sodium canned vegetables.   Fruits  All fresh, canned (in natural juice), or frozen fruits.  Meat and Other Protein Products  Ground beef (85% or leaner), grass-fed beef, or beef trimmed of fat. Skinless chicken or turkey. Ground chicken or turkey. Pork trimmed of fat. All fish and seafood. Eggs. Dried beans, peas, or lentils. Unsalted nuts and seeds. Unsalted canned beans.  Dairy  Low-fat dairy products, such as skim or 1% milk, 2% or reduced-fat cheeses, low-fat ricotta or cottage cheese, or plain low-fat yogurt. Low-sodium or reduced-sodium cheeses.  Fats and Oils  Tub margarines without trans fats. Light or reduced-fat mayonnaise and salad dressings (reduced sodium). Avocado. Safflower, olive, or canola oils. Natural peanut or almond butter.  Other  Unsalted popcorn and pretzels.  The items listed above may not be a complete list of recommended foods or beverages. Contact your dietitian for more options.  WHAT FOODS ARE NOT RECOMMENDED?  Grains  White bread. White pasta. White rice. Refined cornbread. Bagels and croissants. Crackers that contain trans fat.  Vegetables  Creamed or fried vegetables. Vegetables in a cheese sauce. Regular canned vegetables. Regular canned tomato sauce and paste. Regular tomato and vegetable juices.  Fruits  Dried fruits. Canned fruit in light or heavy syrup. Fruit juice.  Meat and Other Protein   Products  Fatty cuts of meat. Ribs, chicken wings, bacon, sausage, bologna, salami, chitterlings, fatback, hot dogs, bratwurst, and packaged luncheon meats. Salted nuts and seeds. Canned beans with salt.  Dairy  Whole or 2% milk, cream, half-and-half, and cream cheese. Whole-fat or sweetened yogurt. Full-fat cheeses or blue cheese. Nondairy creamers and whipped toppings. Processed cheese, cheese spreads, or cheese  curds.  Condiments  Onion and garlic salt, seasoned salt, table salt, and sea salt. Canned and packaged gravies. Worcestershire sauce. Tartar sauce. Barbecue sauce. Teriyaki sauce. Soy sauce, including reduced sodium. Steak sauce. Fish sauce. Oyster sauce. Cocktail sauce. Horseradish. Ketchup and mustard. Meat flavorings and tenderizers. Bouillon cubes. Hot sauce. Tabasco sauce. Marinades. Taco seasonings. Relishes.  Fats and Oils  Butter, stick margarine, lard, shortening, ghee, and bacon fat. Coconut, palm kernel, or palm oils. Regular salad dressings.  Other  Pickles and olives. Salted popcorn and pretzels.  The items listed above may not be a complete list of foods and beverages to avoid. Contact your dietitian for more information.  WHERE CAN I FIND MORE INFORMATION?  National Heart, Lung, and Blood Institute: www.nhlbi.nih.gov/health/health-topics/topics/dash/     This information is not intended to replace advice given to you by your health care provider. Make sure you discuss any questions you have with your health care provider.     Document Released: 12/13/2010 Document Revised: 01/14/2014 Document Reviewed: 10/28/2012  Elsevier Interactive Patient Education ©2016 Elsevier Inc.

## 2015-01-20 ENCOUNTER — Encounter (HOSPITAL_COMMUNITY): Payer: Self-pay

## 2015-01-20 ENCOUNTER — Encounter (HOSPITAL_COMMUNITY)
Admission: RE | Admit: 2015-01-20 | Discharge: 2015-01-20 | Disposition: A | Discharge status: Home / Self Care | Payer: Medicare Other | Source: Ambulatory Visit | Attending: Internal Medicine | Admitting: Internal Medicine

## 2015-01-20 ENCOUNTER — Other Ambulatory Visit: Payer: Self-pay

## 2015-01-20 DIAGNOSIS — I10 Essential (primary) hypertension: Secondary | ICD-10-CM | POA: Diagnosis not present

## 2015-01-20 DIAGNOSIS — K2950 Unspecified chronic gastritis without bleeding: Secondary | ICD-10-CM | POA: Diagnosis not present

## 2015-01-20 DIAGNOSIS — F319 Bipolar disorder, unspecified: Secondary | ICD-10-CM | POA: Diagnosis not present

## 2015-01-20 DIAGNOSIS — F17210 Nicotine dependence, cigarettes, uncomplicated: Secondary | ICD-10-CM | POA: Diagnosis not present

## 2015-01-20 DIAGNOSIS — R1012 Left upper quadrant pain: Secondary | ICD-10-CM | POA: Diagnosis not present

## 2015-01-20 DIAGNOSIS — Z79899 Other long term (current) drug therapy: Secondary | ICD-10-CM | POA: Diagnosis not present

## 2015-01-20 DIAGNOSIS — R112 Nausea with vomiting, unspecified: Secondary | ICD-10-CM | POA: Diagnosis not present

## 2015-01-20 DIAGNOSIS — E7800 Pure hypercholesterolemia, unspecified: Secondary | ICD-10-CM | POA: Diagnosis not present

## 2015-01-20 DIAGNOSIS — K219 Gastro-esophageal reflux disease without esophagitis: Secondary | ICD-10-CM | POA: Diagnosis not present

## 2015-01-20 DIAGNOSIS — Z01812 Encounter for preprocedural laboratory examination: Secondary | ICD-10-CM | POA: Diagnosis not present

## 2015-01-20 HISTORY — DX: Major depressive disorder, single episode, unspecified: F32.9

## 2015-01-20 HISTORY — DX: Depression, unspecified: F32.A

## 2015-01-20 HISTORY — DX: Sleep apnea, unspecified: G47.30

## 2015-01-20 LAB — BASIC METABOLIC PANEL
ANION GAP: 7 (ref 5–15)
BUN: 6 mg/dL (ref 6–20)
CHLORIDE: 101 mmol/L (ref 101–111)
CO2: 28 mmol/L (ref 22–32)
CREATININE: 0.78 mg/dL (ref 0.44–1.00)
Calcium: 8.8 mg/dL — ABNORMAL LOW (ref 8.9–10.3)
GFR calc non Af Amer: >60 mL/min (ref >60)
Glucose, Bld: 95 mg/dL (ref 65–99)
POTASSIUM: 3.7 mmol/L (ref 3.5–5.1)
SODIUM: 136 mmol/L (ref 135–145)

## 2015-01-20 LAB — CBC
HEMATOCRIT: 41.1 % (ref 36.0–46.0)
HEMOGLOBIN: 13.8 g/dL (ref 12.0–15.0)
MCH: 31.2 pg (ref 26.0–34.0)
MCHC: 33.6 g/dL (ref 30.0–36.0)
MCV: 93 fL (ref 78.0–100.0)
Platelets: 278 10*3/uL (ref 150–400)
RBC: 4.42 MIL/uL (ref 3.87–5.11)
RDW: 14.1 % (ref 11.5–15.5)
WBC: 6.9 10*3/uL (ref 4.0–10.5)

## 2015-01-23 ENCOUNTER — Ambulatory Visit (HOSPITAL_COMMUNITY): Payer: Medicare Other | Admitting: Anesthesiology

## 2015-01-23 ENCOUNTER — Ambulatory Visit (HOSPITAL_COMMUNITY)
Admission: RE | Admit: 2015-01-23 | Discharge: 2015-01-23 | Disposition: A | Discharge status: Home / Self Care | Payer: Medicare Other | Source: Ambulatory Visit | Attending: Internal Medicine | Admitting: Internal Medicine

## 2015-01-23 ENCOUNTER — Encounter (HOSPITAL_COMMUNITY): Payer: Self-pay | Admitting: *Deleted

## 2015-01-23 ENCOUNTER — Encounter (HOSPITAL_COMMUNITY)
Admission: RE | Disposition: A | Discharge status: Home / Self Care | Payer: Self-pay | Source: Ambulatory Visit | Attending: Internal Medicine

## 2015-01-23 DIAGNOSIS — K219 Gastro-esophageal reflux disease without esophagitis: Secondary | ICD-10-CM | POA: Insufficient documentation

## 2015-01-23 DIAGNOSIS — K2950 Unspecified chronic gastritis without bleeding: Secondary | ICD-10-CM | POA: Insufficient documentation

## 2015-01-23 DIAGNOSIS — F319 Bipolar disorder, unspecified: Secondary | ICD-10-CM | POA: Insufficient documentation

## 2015-01-23 DIAGNOSIS — Z01812 Encounter for preprocedural laboratory examination: Secondary | ICD-10-CM | POA: Diagnosis not present

## 2015-01-23 DIAGNOSIS — F17210 Nicotine dependence, cigarettes, uncomplicated: Secondary | ICD-10-CM | POA: Insufficient documentation

## 2015-01-23 DIAGNOSIS — K3189 Other diseases of stomach and duodenum: Secondary | ICD-10-CM | POA: Diagnosis not present

## 2015-01-23 DIAGNOSIS — R112 Nausea with vomiting, unspecified: Secondary | ICD-10-CM | POA: Diagnosis not present

## 2015-01-23 DIAGNOSIS — I10 Essential (primary) hypertension: Secondary | ICD-10-CM | POA: Insufficient documentation

## 2015-01-23 DIAGNOSIS — R1012 Left upper quadrant pain: Secondary | ICD-10-CM | POA: Insufficient documentation

## 2015-01-23 DIAGNOSIS — Z79899 Other long term (current) drug therapy: Secondary | ICD-10-CM | POA: Insufficient documentation

## 2015-01-23 DIAGNOSIS — E7800 Pure hypercholesterolemia, unspecified: Secondary | ICD-10-CM | POA: Insufficient documentation

## 2015-01-23 HISTORY — PX: ESOPHAGOGASTRODUODENOSCOPY (EGD) WITH PROPOFOL: SHX5813

## 2015-01-23 HISTORY — PX: BIOPSY: SHX5522

## 2015-01-23 SURGERY — ESOPHAGOGASTRODUODENOSCOPY (EGD) WITH PROPOFOL
Anesthesia: Monitor Anesthesia Care

## 2015-01-23 MED ORDER — LIDOCAINE HCL (PF) 1 % IJ SOLN
INTRAMUSCULAR | Status: AC
  Filled 2015-01-23: qty 10

## 2015-01-23 MED ORDER — ONDANSETRON HCL 4 MG/2ML IJ SOLN
INTRAMUSCULAR | Status: AC
  Filled 2015-01-23: qty 2

## 2015-01-23 MED ORDER — LACTATED RINGERS IV SOLN
INTRAVENOUS | Status: DC
  Administered 2015-01-23: 07:00:00 via INTRAVENOUS

## 2015-01-23 MED ORDER — ONDANSETRON HCL 4 MG/2ML IJ SOLN: 4.0000 mg | Freq: Once | INTRAMUSCULAR | Status: DC | PRN

## 2015-01-23 MED ORDER — LIDOCAINE VISCOUS 2 % MT SOLN
6.0000 mL | Freq: Once | OROMUCOSAL | Status: AC
  Administered 2015-01-23: 6 mL via OROMUCOSAL

## 2015-01-23 MED ORDER — FENTANYL CITRATE (PF) 100 MCG/2ML IJ SOLN
INTRAMUSCULAR | Status: AC
  Filled 2015-01-23: qty 2

## 2015-01-23 MED ORDER — MIDAZOLAM HCL 2 MG/2ML IJ SOLN
INTRAMUSCULAR | Status: AC
  Filled 2015-01-23: qty 2

## 2015-01-23 MED ORDER — FENTANYL CITRATE (PF) 100 MCG/2ML IJ SOLN
25.0000 ug | INTRAMUSCULAR | Status: AC
  Administered 2015-01-23: 25 ug via INTRAVENOUS

## 2015-01-23 MED ORDER — FENTANYL CITRATE (PF) 100 MCG/2ML IJ SOLN: 25.0000 ug | INTRAMUSCULAR | Status: DC | PRN

## 2015-01-23 MED ORDER — LIDOCAINE HCL (CARDIAC) 10 MG/ML IV SOLN
INTRAVENOUS | Status: DC | PRN
  Administered 2015-01-23: 35 mg via INTRAVENOUS

## 2015-01-23 MED ORDER — MIDAZOLAM HCL 2 MG/2ML IJ SOLN
1.0000 mg | INTRAMUSCULAR | Status: DC | PRN
  Administered 2015-01-23: 2 mg via INTRAVENOUS
  Filled 2015-01-23 (×2): qty 2

## 2015-01-23 MED ORDER — LIDOCAINE VISCOUS 2 % MT SOLN
OROMUCOSAL | Status: AC
  Filled 2015-01-23: qty 15

## 2015-01-23 MED ORDER — PROPOFOL 500 MG/50ML IV EMUL
INTRAVENOUS | Status: DC | PRN
  Administered 2015-01-23: 100 ug/kg/min via INTRAVENOUS

## 2015-01-23 MED ORDER — ONDANSETRON HCL 4 MG/2ML IJ SOLN
4.0000 mg | Freq: Once | INTRAMUSCULAR | Status: AC
  Administered 2015-01-23: 4 mg via INTRAVENOUS

## 2015-01-23 NOTE — Op Note (Signed)
[facility] [address] [place], [id number]   ENDOSCOPY PROCEDURE REPORT  PATIENT: Christine Rogers, Christine H  MR#: [id number] BIRTHDATE: March 28, 1940 , 74  yrs. old GENDER: female ENDOSCOPIST: R.  Roetta SessionsMichael Symia Herdt, MD [facility] FACG REFERRED BY:  Samuel Jesterynthia Butler PROCEDURE DATE:  01/23/2015 PROCEDURE:  EGD w/ biopsy INDICATIONS:  nausea and vomiting, GERD. MEDICATIONS: Deep sedation per Dr.  [facility] ASA CLASS:      Class II  CONSENT: The risks, benefits, limitations, alternatives and imponderables have been discussed.  The potential for biopsy, esophogeal dilation, etc. have also been reviewed.  Questions have been answered.  All parties agreeable.  Please see the history and physical in the medical record for more information.  DESCRIPTION OF PROCEDURE: After the risks benefits and alternatives of the procedure were thoroughly explained, informed consent was obtained.  The [id number] ([id number] endoscope was introduced through the mouth and advanced to the second portion of the duodenum , limited by Without limitations. The instrument was slowly withdrawn as the mucosa was fully examined. Estimated blood loss is zero unless otherwise noted in this procedure report.    Normal-appearing tubular esophagus.  Stomach empty.  Friable pyloric channel without ulcer or infiltrating process observed. Scarlatiniform appearing gastric mucosa diffusely. Normal-appearing first and second portion of the duodenum.  Biopsies of abnormal gastric mucosa taken.  Retroflexed views revealed no abnormalities.     The scope was then withdrawn from the patient and the procedure completed.  COMPLICATIONS: There were no immediate complications.  ENDOSCOPIC IMPRESSION: Abnormal gastric mucosa/pyloric channel of uncertain significance?"status post biopsy  RECOMMENDATIONS: Follow-up on pathology. See a dentist as previously recommended. Colonoscopy previously recommended but declined.  Patient advised to have one and if she changes her mind we can set it up.  REPEAT EXAM:  eSigned:  R. Roetta SessionsMichael Yuval Rubens, MD FACP [place] [facility]FACG 01/23/2015 7:59 AM    CC:  CPT CODES: ICD CODES:  The ICD and CPT codes recommended by this software are interpretations from the data that the clinical staff has captured with the software.  The verification of the translation of this report to the ICD and CPT codes and modifiers is the sole responsibility of the health care institution and practicing physician where this report was generated.  [employer] will not be held responsible for the validity of the ICD and CPT codes included on this report.  AMA assumes no liability for data contained or not contained herein. CPT is a Publishing rights managerregistered trademark of the [employer].  PATIENT NAME:  Christine Rogers, Christine H MR#: [id number]

## 2015-01-23 NOTE — Transfer of Care (Signed)
Immediate Anesthesia Transfer of Care Note  Patient: Christine Rogers  Procedure(s) Performed: Procedure(s) with comments: ESOPHAGOGASTRODUODENOSCOPY (EGD) WITH PROPOFOL (N/A) - 730 BIOPSY - gastric biopsies  Patient Location: PACU  Anesthesia Type:MAC  Level of Consciousness: awake and patient cooperative  Airway & Oxygen Therapy: Patient Spontanous Breathing and Patient connected to nasal cannula oxygen  Post-op Assessment: Report given to RN, Post -op Vital signs reviewed and stable and Patient moving all extremities  Post vital signs: Reviewed and stable   Complications: No apparent anesthesia complications

## 2015-01-23 NOTE — Anesthesia Procedure Notes (Signed)
Procedure Name: MAC Date/Time: 01/23/2015 7:28 AM Performed by: Franco NonesYATES, Chales Pelissier S Pre-anesthesia Checklist: Patient identified, Emergency Drugs available, Suction available, Timeout performed and Patient being monitored Patient Re-evaluated:Patient Re-evaluated prior to inductionOxygen Delivery Method: Non-rebreather mask

## 2015-01-23 NOTE — Anesthesia Preprocedure Evaluation (Signed)
Anesthesia Evaluation  Patient identified by MRN, date of birth, ID band Patient awake    Reviewed: Allergy & Precautions, NPO status , Patient's Chart, lab work & pertinent test results  Airway Mallampati: II  TM Distance: >3 FB     Dental  (+) Edentulous Upper, Edentulous Lower   Pulmonary sleep apnea , Current Smoker,    breath sounds clear to auscultation       Cardiovascular hypertension, + CAD and + Peripheral Vascular Disease   Rhythm:Regular Rate:Normal     Neuro/Psych PSYCHIATRIC DISORDERS Depression Bipolar Disorder    GI/Hepatic GERD  ,  Endo/Other    Renal/GU      Musculoskeletal   Abdominal   Peds  Hematology   Anesthesia Other Findings   Reproductive/Obstetrics                             Anesthesia Physical Anesthesia Plan  ASA: III  Anesthesia Plan: MAC   Post-op Pain Management:    Induction: Intravenous  Airway Management Planned: Simple Face Mask  Additional Equipment:   Intra-op Plan:   Post-operative Plan:   Informed Consent: I have reviewed the patients History and Physical, chart, labs and discussed the procedure including the risks, benefits and alternatives for the proposed anesthesia with the patient or authorized representative who has indicated his/her understanding and acceptance.     Plan Discussed with:   Anesthesia Plan Comments:         Anesthesia Quick Evaluation

## 2015-01-23 NOTE — H&P (View-Only) (Signed)
Primary Care Physician:  Samuel JesterYNTHIA BUTLER, DO  Primary Gastroenterologist:  Roetta SessionsMichael Rourk, MD   Chief Complaint  Patient presents with  . Gastroesophageal Reflux    HPI:  Christine Rogers is a 75 y.o. female here for further evaluation of GERD symptoms at the request of PCP.  Patient states she's been sick for at least 9 months. She's had 2 ER visits earlier this year for chest pain felt to be related to GERD. Complains of nausea and vomiting may occur for several days in a row when then go a week or 2 without any symptoms. Constant nausea. Some left upper quadrant pain. Denies typical heartburn. States she never really had that. She's been on pantoprazole for years. Initially diagnosed with GERD due to atypical chest pain years ago. Twenty pound weigth gain in six month. She can't figure out why. States she is eating 1 meal a day. She's been so nauseated. This is the most she is overweight. Typically weighs around 100 pounds. Recently she increased her pantoprazole to 3 times a day on her own. She is also taking Zofran 3 times a day.  Chronically constipated, usually has a bowel movement every 2-3 days. Last bowel movement at this point was 1 week ago. Bright red blood per rectum yesterday, no bowel movement just felt moist and went to the restroom. No melena. No prior EGD or colonoscopy. Takes a BC powder about every 2-3 days for headache. Complains of sore mouth, dry mouth. Currently not interested in a colonoscopy until current symptoms are improved.    Wants rx medication for constipation, cheaper than OTC.   Previous cardiac catheterization, remotely. No cardiac stents.  Current Outpatient Prescriptions  Medication Sig Dispense Refill  . amLODipine (NORVASC) 2.5 MG tablet Take 2.5 mg by mouth every evening.    Marland Kitchen. lisinopril (PRINIVIL,ZESTRIL) 2.5 MG tablet Take 2.5 mg by mouth every evening.    . ondansetron (ZOFRAN) 4 MG tablet Take 4 mg by mouth every 8 (eight) hours as needed.     Marland Kitchen.  oxycodone (ROXICODONE) 30 MG immediate release tablet Take 30 mg by mouth 4 (four) times daily as needed. FOR PAIN    . OXYCONTIN 80 MG T12A 12 hr tablet Take 80 mg by mouth every 12 (twelve) hours.    . pantoprazole (PROTONIX) 40 MG tablet Take 40 mg by mouth every evening. Patient says she increased to tid on her own.    . venlafaxine XR (EFFEXOR-XR) 75 MG 24 hr capsule Take 75 mg by mouth every evening.     No current facility-administered medications for this visit.    Allergies as of 12/27/2014 - Review Complete 12/27/2014  Allergen Reaction Noted  . Imitrex [sumatriptan] Anaphylaxis and Swelling 04/10/2014  . Ciprofloxacin    . Meperidine hcl Nausea And Vomiting     Past Medical History  Diagnosis Date  . Hypertension   . GERD (gastroesophageal reflux disease)   . Bipolar 1 disorder (HCC)   . High cholesterol   . DDD (degenerative disc disease), lumbar     Past Surgical History  Procedure Laterality Date  . Abdominal hysterectomy    . Back surgery    . Tonsillectomy      Family History  Problem Relation Age of Onset  . Heart disease Father   . Colon cancer Neg Hx     Social History   Social History  . Marital Status: Divorced    Spouse Name: N/A  . Number of Children: N/A  .  Years of Education: N/A   Occupational History  . Not on file.   Social History Main Topics  . Smoking status: Current Some Day Smoker -- 1.00 packs/day    Types: Cigarettes  . Smokeless tobacco: Not on file  . Alcohol Use: No  . Drug Use: No  . Sexual Activity: Not on file   Other Topics Concern  . Not on file   Social History Narrative      ROS:  General: Negative for anorexia, weight loss, fever, chills, fatigue, weakness. Eyes: Negative for vision changes.  ENT: Negative for hoarseness, difficulty swallowing , nasal congestion. CV: Negative for chest pain, angina, palpitations, dyspnea on exertion, peripheral edema.  Respiratory: Negative for dyspnea at rest, dyspnea  on exertion, cough, sputum, wheezing.  GI: See history of present illness. GU:  Negative for dysuria, hematuria, urinary incontinence, urinary frequency, nocturnal urination.  MS: Negative for joint pain, low back pain.  Derm: Negative for rash or itching.  Neuro: Negative for weakness, abnormal sensation, seizure, frequent headaches, memory loss, confusion.  Psych: Negative for anxiety, depression, suicidal ideation, hallucinations.  Endo: see hpi  Heme: Negative for bruising or bleeding. Allergy: Negative for rash or hives.    Physical Examination:  BP 144/85 mmHg  Pulse 76  Temp(Src) 97.8 F (36.6 C)  Ht 5\' 1"  (1.549 m)  Wt 116 lb (52.617 kg)  BMI 21.93 kg/m2   General: Well-nourished, well-developed in no acute distress.  Head: Normocephalic, atraumatic.   Eyes: Conjunctiva pink, no icterus. Mouth: Oropharyngeal mucosa moist and pink , no lesions erythema or exudate. Neck: Supple without thyromegaly, masses, or lymphadenopathy.  Lungs: Clear to auscultation bilaterally.  Heart: Regular rate and rhythm, no murmurs rubs or gallops.  Abdomen: Bowel sounds are normal, nontender, nondistended, no hepatosplenomegaly or masses, no abdominal bruits or    hernia , no rebound or guarding.   Rectal: not performed Extremities: No lower extremity edema. No clubbing or deformities.  Neuro: Alert and oriented x 4 , grossly normal neurologically.  Skin: Warm and dry, no rash or jaundice.   Psych: Alert and cooperative, normal mood and affect.  Labs: Lab Results  Component Value Date   WBC 9.1 04/25/2014   HGB 15.3* 04/25/2014   HCT 44.8 04/25/2014   MCV 93.9 04/25/2014   PLT 336 04/25/2014   Lab Results  Component Value Date   CREATININE 1.09 04/25/2014   BUN 6 04/25/2014   NA 137 04/25/2014   K 3.4* 04/25/2014   CL 104 04/25/2014   CO2 22 04/25/2014   Lab Results  Component Value Date   ALT 9 04/10/2014   AST 20 04/10/2014   ALKPHOS 116 04/10/2014   BILITOT 0.5  04/10/2014     Imaging Studies: No results found.

## 2015-01-23 NOTE — Interval H&P Note (Signed)
History and Physical Interval Note:  01/23/2015 7:24 AM  Christine DunkBarbara H Rogers  has presented today for surgery, with the diagnosis of N/V/GERD  The various methods of treatment have been discussed with the patient and family. After consideration of risks, benefits and other options for treatment, the patient has consented to  Procedure(s) with comments: ESOPHAGOGASTRODUODENOSCOPY (EGD) WITH PROPOFOL (N/A) - 730 as a surgical intervention .  The patient's history has been reviewed, patient examined, no change in status, stable for surgery.  I have reviewed the patient's chart and labs.  Questions were answered to the patient's satisfaction.     Audwin Semper  No change; no dysphagia; Dx EGD per plan. The risks, benefits, limitations, alternatives and imponderables have been reviewed with the patient. Potential for esophageal dilation, biopsy, etc. have also been reviewed.  Questions have been answered. All parties agreeable.

## 2015-01-23 NOTE — Discharge Instructions (Signed)
EGD Discharge instructions Please read the instructions outlined below and refer to this sheet in the next few weeks. These discharge instructions provide you with general information on caring for yourself after you leave the hospital. Your doctor may also give you specific instructions. While your treatment has been planned according to the most current medical practices available, unavoidable complications occasionally occur. If you have any problems or questions after discharge, please call your doctor. ACTIVITY  You may resume your regular activity but move at a slower pace for the next 24 hours.   Take frequent rest periods for the next 24 hours.   Walking will help expel (get rid of) the air and reduce the bloated feeling in your abdomen.   No driving for 24 hours (because of the anesthesia (medicine) used during the test).   You may shower.   Do not sign any important legal documents or operate any machinery for 24 hours (because of the anesthesia used during the test).  NUTRITION  Drink plenty of fluids.   You may resume your normal diet.   Begin with a light meal and progress to your normal diet.   Avoid alcoholic beverages for 24 hours or as instructed by your caregiver.  MEDICATIONS  You may resume your normal medications unless your caregiver tells you otherwise.  WHAT YOU CAN EXPECT TODAY  You may experience abdominal discomfort such as a feeling of fullness or gas pains.  FOLLOW-UP  Your doctor will discuss the results of your test with you.  SEEK IMMEDIATE MEDICAL ATTENTION IF ANY OF THE FOLLOWING OCCUR:  Excessive nausea (feeling sick to your stomach) and/or vomiting.   Severe abdominal pain and distention (swelling).   Trouble swallowing.   Temperature over 101 F (37.8 C).   Rectal bleeding or vomiting of blood.    Further recommendations to follow pending review of pathology report  See a dentist for your oral symptoms  Since you have had some  rectal bleeding, we have previously recommended a colonoscopy. If you decide to have one done, please let us know. Not having one could negatively impact on your health.Esophagogastroduodenoscopy Esophagogastroduodenoscopy (EGD) is a procedure that is used to examine the lining of the esophagus, stomach, and first part of the small intestine (duodenum). A long, flexible, lighted tube with a camera attached (endoscope) is inserted down the throat to view these organs. This procedure is done to detect problems or abnormalities, such as inflammation, bleeding, ulcers, or growths, in order to treat them. The procedure lasts 5-20 minutes. It is usually an outpatient procedure, but it may need to be performed in a hospital in emergency cases. LET [facility] PROVIDER KNOW ABOUT:  Any allergies you have.  All medicines you are taking, including vitamins, herbs, eye drops, creams, and over-the-counter medicines.  Previous problems you or members of your family have had with the use of anesthetics.  Any blood disorders you have.  Previous surgeries you have had.  Medical conditions you have. RISKS AND COMPLICATIONS Generally, this is a safe procedure. However, problems can occur and include:  Infection.  Bleeding.  Tearing (perforation) of the esophagus, stomach, or duodenum.  Difficulty breathing or not being able to breathe.  Excessive sweating.  Spasms of the larynx.  Slowed heartbeat.  Low blood pressure. BEFORE THE PROCEDURE  Do not eat or drink anything after midnight on the night before the procedure or as directed by your health care provider.  Do not take your regular medicines before the  procedure if your health care provider asks you not to. Ask your health care provider about changing or stopping those medicines.  If you wear dentures, be prepared to remove them before the procedure.  Arrange for someone to drive you home after the procedure. PROCEDURE  A numbing  medicine (local anesthetic) may be sprayed in your throat for comfort and to stop you from gagging or coughing.  You will have an IV tube inserted in a vein in your hand or arm. You will receive medicines and fluids through this tube.  You will be given a medicine to relax you (sedative).  A pain reliever will be given through the IV tube.  A mouth guard may be placed in your mouth to protect your teeth and to keep you from biting on the endoscope.  You will be asked to lie on your left side.  The endoscope will be inserted down your throat and into your esophagus, stomach, and duodenum.  Air will be put through the endoscope to allow your health care provider to clearly view the lining of your esophagus.  The lining of your esophagus, stomach, and duodenum will be examined. During the exam, your health care provider may:  Remove tissue to be examined under a microscope (biopsy) for inflammation, infection, or other medical problems.  Remove growths.  Remove objects (foreign bodies) that are stuck.  Treat any bleeding with medicines or other devices that stop tissues from bleeding (hot cautery, clipping devices).  Widen (dilate) or stretch narrowed areas of your esophagus and stomach.  The endoscope will be withdrawn. AFTER THE PROCEDURE  You will be taken to a recovery area for observation. Your blood pressure, heart rate, breathing rate, and blood oxygen level will be monitored often until the medicines you were given have worn off.  Do not eat or drink anything until the numbing medicine has worn off and your gag reflex has returned. You may choke.  Your health care provider should be able to discuss his or her findings with you. It will take longer to discuss the test results if any biopsies were taken.   This information is not intended to replace advice given to you by your health care provider. Make sure you discuss any questions you have with your health care provider.     Document Released: 04/26/2004 Document Revised: 01/14/2014 Document Reviewed: 11/27/2011 Elsevier Interactive Patient Education [employer]

## 2015-01-23 NOTE — Anesthesia Postprocedure Evaluation (Signed)
Anesthesia Post Note  Patient: Christine Rogers  Procedure(s) Performed: Procedure(s) (LRB): ESOPHAGOGASTRODUODENOSCOPY (EGD) WITH PROPOFOL (N/A) BIOPSY  Patient location during evaluation: PACU Anesthesia Type: MAC Level of consciousness: awake and alert Pain management: pain level controlled Vital Signs Assessment: post-procedure vital signs reviewed and stable Respiratory status: spontaneous breathing and patient connected to nasal cannula oxygen Cardiovascular status: stable Anesthetic complications: no    Last Vitals:  Filed Vitals:   01/23/15 0715 01/23/15 0720  BP: 127/76 147/74  Temp:    Resp: 22 25    Last Pain: There were no vitals filed for this visit.               Minerva AreolaYATES,Wallis Vancott

## 2015-01-24 ENCOUNTER — Encounter: Payer: Self-pay | Admitting: Internal Medicine

## 2015-01-25 ENCOUNTER — Encounter (HOSPITAL_COMMUNITY): Payer: Self-pay | Admitting: Internal Medicine

## 2015-01-25 ENCOUNTER — Telehealth: Payer: Self-pay

## 2015-01-25 NOTE — Telephone Encounter (Signed)
Per RMR-  Send letter to patient.  Send copy of letter with path to referring provider and PCP.  Let's hold the Protonix for now. 2 week trial of Dexilant 60 mg daily-samples okay.   Office visit with [place] in 6 weeks

## 2015-01-25 NOTE — Telephone Encounter (Signed)
OV MADE APPT CARD MAILED 

## 2015-01-27 NOTE — Telephone Encounter (Signed)
Letter has been mailed to the pt  

## 2015-01-30 NOTE — Telephone Encounter (Signed)
Tried to call pt- NA 

## 2015-02-02 NOTE — Telephone Encounter (Signed)
Tried to call pt- NA 

## 2015-02-07 NOTE — Telephone Encounter (Signed)
Pt aware. Samples are at the front desk.

## 2015-02-20 ENCOUNTER — Telehealth: Payer: Self-pay | Admitting: Internal Medicine

## 2015-02-20 NOTE — Telephone Encounter (Signed)
Pt called to say that she has been taking the dexilant samples and has 2 left and wanted to know if she was suppose to continue taking them. Please advise and call [phone]

## 2015-02-20 NOTE — Telephone Encounter (Signed)
Spoke with the pt- she has been doing well since taking dexilant and has only had one episode of nausea since starting it. Do you want [place] to send in an Rx for it?   explained to the pt that we may have to do a PA with her insurance and that they may require her to try and fail their preferred medications before they will pay for dexilant, but I wont know until we send it in to the pharmacy. She is also aware that RMR is off today and it will be tomorrow before I can send in anything.

## 2015-02-21 ENCOUNTER — Other Ambulatory Visit: Payer: Self-pay | Admitting: Internal Medicine

## 2015-02-21 MED ORDER — DEXLANSOPRAZOLE 60 MG PO CPDR: 60.0000 mg | DELAYED_RELEASE_CAPSULE | Freq: Every day | ORAL | Status: DC

## 2015-02-21 NOTE — Telephone Encounter (Signed)
rx has been sent in to the pharmacy.  

## 2015-02-21 NOTE — Telephone Encounter (Signed)
Glad to hear she is doing well on Dexilant. Let's continue. Please call a prescription in for Dexilant 60 mg on 60 mg 1 daily  -  30 at time with one years refill. Thanks.

## 2015-03-08 ENCOUNTER — Encounter: Payer: Self-pay | Admitting: Gastroenterology

## 2015-03-08 ENCOUNTER — Ambulatory Visit (INDEPENDENT_AMBULATORY_CARE_PROVIDER_SITE_OTHER): Payer: Medicare Other | Admitting: Gastroenterology

## 2015-03-08 VITALS — BP 150/78 | HR 68 | Temp 98.2°F | Ht 61.0 in | Wt 116.4 lb

## 2015-03-08 DIAGNOSIS — R112 Nausea with vomiting, unspecified: Secondary | ICD-10-CM

## 2015-03-08 DIAGNOSIS — K3189 Other diseases of stomach and duodenum: Secondary | ICD-10-CM

## 2015-03-08 DIAGNOSIS — K219 Gastro-esophageal reflux disease without esophagitis: Secondary | ICD-10-CM

## 2015-03-08 DIAGNOSIS — R1011 Right upper quadrant pain: Secondary | ICD-10-CM | POA: Diagnosis not present

## 2015-03-08 DIAGNOSIS — K5900 Constipation, unspecified: Secondary | ICD-10-CM

## 2015-03-08 NOTE — Progress Notes (Signed)
Primary Care Physician: [name], DO  Primary Gastroenterologist:  Roetta Sessions, MD   Chief Complaint  Patient presents with  . Follow-up    HPI: Christine Rogers is a 75 y.o. female here for follow-up. She underwent an EGD in January 2017 for nausea, vomiting, GERD. She had friable pyloric channel without ulcer or infiltrating process, scarlatiniform appearing gastric mucosa diffusely, gastric biopsy showed mild chronic gastritis with very focal intestinal metaplasia, negative for H pylori. She has been on Dexilant since her procedure.   Previously declined colonoscopy. History of abnormality in the right upper lung lobe on chest x-ray in April 2016. Chest CT was recommended and performed at [facility] in 05/2014. 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 Nonspecific intra-and extrahepatic biliary dilation. No evidence of suspicious pulmonary nodule or mass. Opacity seen on prior chest x-ray corresponds with pleural-parenchymal scarring in the right greater than left apex.  Linzess once weekly. If takes regular, then has watery stools. Nausea got better right away with Dexilant but two weeks ago started having recurrent nausea and new symptoms of bloating/ruq pain. Also with a lot of belching and gas. Especially with meals. Sometimes symptoms happen if has gone a long time without food. No heartburn. BC decreased, one every 3-4 days.    Current Outpatient Prescriptions  Medication Sig Dispense Refill  . amLODipine (NORVASC) 2.5 MG tablet Take 2.5 mg by mouth every evening.    Marland Kitchen dexlansoprazole (DEXILANT) 60 MG capsule Take 1 capsule (60 mg total) by mouth daily. 30 capsule 11  . Linaclotide (LINZESS) 145 MCG CAPS capsule Take 1 capsule (145 mcg total) by mouth daily. On empty stomach (Patient taking differently: Take 145 mcg by mouth as needed. On empty stomach) 30 capsule 5  . lisinopril (PRINIVIL,ZESTRIL) 2.5 MG tablet Take 2.5 mg by mouth every evening.    . ondansetron (ZOFRAN) 4 MG  tablet Take 4 mg by mouth every 8 (eight) hours as needed.     Marland Kitchen oxycodone (ROXICODONE) 30 MG immediate release tablet Take 30 mg by mouth 4 (four) times daily as needed. FOR PAIN    . OXYCONTIN 80 MG T12A 12 hr tablet Take 80 mg by mouth every 12 (twelve) hours.    Marland Kitchen venlafaxine XR (EFFEXOR-XR) 75 MG 24 hr capsule Take 75 mg by mouth every evening.     No current facility-administered medications for this visit.    Allergies as of 03/08/2015 - Review Complete 03/08/2015  Allergen Reaction Noted  . Imitrex [sumatriptan] Anaphylaxis and Swelling 04/10/2014  . Ciprofloxacin    . Meperidine hcl Nausea And Vomiting     ROS:  General: Negative for anorexia, weight loss, fever, chills, fatigue, weakness. ENT: Negative for hoarseness, difficulty swallowing , nasal congestion. CV: Negative for chest pain, angina, palpitations, dyspnea on exertion, peripheral edema.  Respiratory: Negative for dyspnea at rest, dyspnea on exertion, cough, sputum, wheezing.  GI: See history of present illness. GU:  Negative for dysuria, hematuria, urinary incontinence, urinary frequency, nocturnal urination.  Endo: Negative for unusual weight change.    Physical Examination:   BP 150/78 mmHg  Pulse 68  Temp(Src) 98.2 F (36.8 C)  Ht  (1.549 m)  Wt 116 lb 6.4 oz (52.799 kg)  BMI 22.01 kg/m2  General: Well-nourished, well-developed in no acute distress.  Eyes: No icterus. Mouth: Oropharyngeal mucosa moist and pink , no lesions erythema or exudate. Lungs: Clear to auscultation bilaterally.  Heart: Regular rate and rhythm, no murmurs rubs or gallops.  Abdomen: Bowel sounds are normal, nontender, nondistended, no hepatosplenomegaly or masses, no abdominal bruits or hernia , no rebound or guarding.   Extremities: No lower extremity edema. No clubbing or deformities. Neuro: Alert and oriented x 4   Skin: Warm and dry, no jaundice.   Psych: Alert and cooperative, normal mood and affect.  Labs:  Lab  Results  Component Value Date   CREATININE 0.78 01/20/2015   BUN 6 01/20/2015   NA 136 01/20/2015   K 3.7 01/20/2015   CL 101 01/20/2015   CO2 28 01/20/2015   Lab Results  Component Value Date   WBC 6.9 01/20/2015   HGB 13.8 01/20/2015   HCT 41.1 01/20/2015   MCV 93.0 01/20/2015   PLT 278 01/20/2015    Imaging Studies: No results found.

## 2015-03-08 NOTE — Assessment & Plan Note (Addendum)
Continue Dexilant for GERD/gastritis. Goal of NO ASA powders.

## 2015-03-08 NOTE — Assessment & Plan Note (Signed)
Continue Linzess prn. Consider trial of new lower dose Linzess once available.

## 2015-03-08 NOTE — Progress Notes (Signed)
cc'ed to pcp °

## 2015-03-08 NOTE — Patient Instructions (Signed)
1. Please have your labs and ultrasound done as scheduled.  2. Continue Dexilant. 3. Limit BC powders as much as possible with goal of stopping.

## 2015-03-08 NOTE — Assessment & Plan Note (Signed)
75 y/o female with h/o gastritis in setting of ASA powders. Symptoms had improved on Dexilant. Now with two week h/o ruq pain, recurrent nausea/bloating postprandially but also without meals. Intermittent in nature. H/o intra/extrahepatic biliary dilation on chest CT last year. U/s two years ago without gallstones.   At this time, we need to rule out biliary etiology. Start with abd u/s and LFTs. Cannot rule out need for CT abd/pelvis if u/s is unremarkable.   Patient continues to decline colonoscopy at this time.

## 2015-03-13 ENCOUNTER — Ambulatory Visit (HOSPITAL_COMMUNITY)
Admission: RE | Admit: 2015-03-13 | Discharge: 2015-03-13 | Disposition: A | Discharge status: Home / Self Care | Payer: Medicare Other | Source: Ambulatory Visit | Attending: Gastroenterology | Admitting: Gastroenterology

## 2015-03-13 DIAGNOSIS — R1011 Right upper quadrant pain: Secondary | ICD-10-CM | POA: Insufficient documentation

## 2015-03-13 LAB — HEPATIC FUNCTION PANEL
ALBUMIN: 3.3 g/dL — AB (ref 3.6–5.1)
ALK PHOS: 114 U/L (ref 33–130)
ALT: 4 U/L — AB (ref 6–29)
AST: 13 U/L (ref 10–35)
Bilirubin, Direct: 0.1 mg/dL (ref <=0.2)
Indirect Bilirubin: 0.2 mg/dL (ref 0.2–1.2)
TOTAL PROTEIN: 6.1 g/dL (ref 6.1–8.1)
Total Bilirubin: 0.3 mg/dL (ref 0.2–1.2)

## 2015-03-13 NOTE — Progress Notes (Signed)
Quick Note:  Please let patient know her LFTs and U/s abd were unremarkable. I would recommend no ASA, continue Dexilant, if ongoing symptoms of nausea, ruq pain then we could consider HIDA with CCK to complete gb work up.  ______

## 2015-03-25 ENCOUNTER — Encounter (HOSPITAL_COMMUNITY): Payer: Self-pay

## 2015-03-25 ENCOUNTER — Emergency Department (HOSPITAL_COMMUNITY)
Admission: EM | Admit: 2015-03-25 | Discharge: 2015-03-26 | Disposition: A | Discharge status: Home / Self Care | Payer: Medicare Other | Attending: Emergency Medicine | Admitting: Emergency Medicine

## 2015-03-25 DIAGNOSIS — R1110 Vomiting, unspecified: Secondary | ICD-10-CM | POA: Diagnosis not present

## 2015-03-25 DIAGNOSIS — F319 Bipolar disorder, unspecified: Secondary | ICD-10-CM | POA: Diagnosis not present

## 2015-03-25 DIAGNOSIS — G8929 Other chronic pain: Secondary | ICD-10-CM | POA: Diagnosis not present

## 2015-03-25 DIAGNOSIS — K219 Gastro-esophageal reflux disease without esophagitis: Secondary | ICD-10-CM | POA: Diagnosis not present

## 2015-03-25 DIAGNOSIS — I10 Essential (primary) hypertension: Secondary | ICD-10-CM | POA: Diagnosis not present

## 2015-03-25 DIAGNOSIS — Z8739 Personal history of other diseases of the musculoskeletal system and connective tissue: Secondary | ICD-10-CM | POA: Diagnosis not present

## 2015-03-25 DIAGNOSIS — R142 Eructation: Secondary | ICD-10-CM | POA: Diagnosis not present

## 2015-03-25 DIAGNOSIS — Z79891 Long term (current) use of opiate analgesic: Secondary | ICD-10-CM | POA: Diagnosis not present

## 2015-03-25 DIAGNOSIS — R630 Anorexia: Secondary | ICD-10-CM | POA: Diagnosis not present

## 2015-03-25 DIAGNOSIS — R1011 Right upper quadrant pain: Secondary | ICD-10-CM

## 2015-03-25 DIAGNOSIS — Z8639 Personal history of other endocrine, nutritional and metabolic disease: Secondary | ICD-10-CM | POA: Diagnosis not present

## 2015-03-25 DIAGNOSIS — R0789 Other chest pain: Secondary | ICD-10-CM | POA: Insufficient documentation

## 2015-03-25 DIAGNOSIS — F17210 Nicotine dependence, cigarettes, uncomplicated: Secondary | ICD-10-CM | POA: Insufficient documentation

## 2015-03-25 DIAGNOSIS — Z79899 Other long term (current) drug therapy: Secondary | ICD-10-CM | POA: Diagnosis not present

## 2015-03-25 NOTE — ED Notes (Signed)
Pt reports onset 1 month RUQ abd pain.  Has been seen for same and had scans done.  Gallbladder and liver normal.  Pt has daly pain and vomits x 4 every day.  Pt is next scheduled for another scan, not sure what.  Today pt reports she is bloated and is belching.

## 2015-03-25 NOTE — ED Provider Notes (Signed)
CSN: [id number]     Arrival date & time 03/25/15  2138 History  By signing my name below, I, Christine Rogers, attest that this documentation has been prepared under the direction and in the presence of Christine Kaplan, MD. Electronically Signed: Doreatha Rogers, ED Scribe. 03/25/2015. 12:26 AM.    Chief Complaint  Patient presents with  . Abdominal Pain   The history is provided by the patient. No language interpreter was used.   HPI Comments: Christine Rogers is a 75 y.o. female with h/o HTN, GERD, high cholesterol who presents to the Emergency Department complaining of moderate, intermittent, sharp, 5/10 RUQ abdominal pain onset 4 weeks ago and worsened this morning with associated increased emesis, decreased appetite, increased belching, substernal chest pressure. Per pt, she has had similar RUQ abdominal pain for 11 months, has been worked up and prescribed dexilant for "inflamed stomach". She had [place] on 03/13/15 that showed no gallstones or gallbladder thickening and was otherwise normal. She states her current episodes of pain are more severe than her chronic pain, last several hours and have no specific patterns throughout the day. Pt notes that her abdominal pain is unaffected by eating. No new nausea. She states her CP is unaffected by ambulation and occasionally radiates between her shoulder blades. She denies diarrhea, hematemesis, melena, hematochezia, dysuria, hematuria, significant weight loss, increased weakness in her legs. Per pt, she has gained 20 pounds with onset of her chronic abdominal pain. No h/o MI, stroke, renal calculi. H/o partial hysterectomy without oophorectomy. Pt is a current smoker for 50 years, 1 ppd. No h/o heavy alcohol use.   Past Medical History  Diagnosis Date  . Hypertension   . GERD (gastroesophageal reflux disease)   . Bipolar 1 disorder (HCC)   . High cholesterol   . DDD (degenerative disc disease), lumbar   . Depression   . Sleep apnea    Past Surgical History   Procedure Laterality Date  . Abdominal hysterectomy    . Back surgery    . Tonsillectomy    . Esophagogastroduodenoscopy (egd) with propofol N/A 01/23/2015    RMR: abnormal gastric mucosa.pyloric channel of uncertain significance status post biopys  . Biopsy  01/23/2015    Procedure: BIOPSY;  Surgeon: Corbin Ade, MD;  [facility];  Service: Endoscopy;;  gastric biopsies   Family History  Problem Relation Age of Onset  . Heart disease Father   . Colon cancer Neg Hx    Social History  Substance Use Topics  . Smoking status: Current Some Day Smoker -- 1.00 packs/day    Types: Cigarettes  . Smokeless tobacco: None  . Alcohol Use: No   OB History    No data available     Review of Systems A complete 10 system review of systems was obtained and all systems are negative except as noted in the HPI and PMH.    Allergies  Imitrex; Ciprofloxacin; and Meperidine hcl  Home Medications   Prior to Admission medications   Medication Sig Start Date End Date Taking? Authorizing Provider  amLODipine (NORVASC) 2.5 MG tablet Take 2.5 mg by mouth every evening.   Yes Historical Provider, MD  benzonatate (TESSALON) 100 MG capsule Take 100 mg by mouth 3 (three) times daily as needed for cough.   Yes Historical Provider, MD  dexlansoprazole (DEXILANT) 60 MG capsule Take 1 capsule (60 mg total) by mouth daily. 02/21/15  Yes Corbin Ade, MD  Linaclotide [facility]) 145 MCG CAPS capsule Take  1 capsule (145 mcg total) by mouth daily. On empty stomach Patient taking differently: Take 145 mcg by mouth daily as needed (constipation). On empty stomach 12/27/14  Yes Tiffany Kocher, PA-C  lisinopril (PRINIVIL,ZESTRIL) 2.5 MG tablet Take 2.5 mg by mouth every evening.   Yes Historical Provider, MD  ondansetron (ZOFRAN) 4 MG tablet Take 4 mg by mouth every 8 (eight) hours as needed for nausea or vomiting.  12/24/14  Yes Historical Provider, MD  oxycodone (ROXICODONE) 30 MG immediate release  tablet Take 30 mg by mouth 4 (four) times daily as needed. FOR PAIN 04/08/14  Yes Historical Provider, MD  OXYCONTIN 80 MG T12A 12 hr tablet Take 40 mg by mouth every 12 (twelve) hours.  03/27/14  Yes Historical Provider, MD  venlafaxine XR (EFFEXOR-XR) 75 MG 24 hr capsule Take 75 mg by mouth every evening.   Yes Historical Provider, MD   BP 128/67 mmHg  Pulse 89  Temp(Src) 98.3 F (36.8 C) (Oral)  Resp 25  SpO2 90% Physical Exam  Constitutional: She is oriented to person, place, and time. She appears well-developed and well-nourished.  HENT:  Head: Normocephalic and atraumatic.  Eyes: Conjunctivae and EOM are normal. Pupils are equal, round, and reactive to light.  Neck: Normal range of motion. Neck supple.  Cardiovascular: Normal rate, regular rhythm and normal heart sounds.  Exam reveals no gallop and no friction rub.   No murmur heard. RRR  Pulmonary/Chest: Effort normal and breath sounds normal. No respiratory distress. She has no wheezes. She has no rales.  Lungs CTA bilaterally.   Abdominal: Soft. Bowel sounds are normal. She exhibits no distension. There is tenderness.  Positive bowel sounds. Tenderness in the RUQ. Negative Murphy's sign.   Musculoskeletal: Normal range of motion.  Neurological: She is alert and oriented to person, place, and time.  2+ and equal radial pulse bilaterally. 2+ and equal femoral pulse.   Skin: Skin is warm and dry.  Psychiatric: She has a normal mood and affect. Her behavior is normal.  Nursing note and vitals reviewed.   ED Course  Procedures (including critical care time) DIAGNOSTIC STUDIES: Oxygen Saturation is 93% on RA, adequate by my interpretation.    COORDINATION OF CARE: 12:21 AM Discussed treatment plan with pt at bedside which includes lab work, CXR, EKG and pt agreed to plan.    Labs Review Labs Reviewed  COMPREHENSIVE METABOLIC PANEL - Abnormal; Notable for the following:    Potassium 3.3 (*)    Chloride 98 (*)    Glucose,  Bld 116 (*)    Albumin 3.2 (*)    ALT 10 (*)    Total Bilirubin 0.2 (*)    All other components within normal limits  CBC WITH DIFFERENTIAL/PLATELET  TROPONIN I  LIPASE, BLOOD  PROTIME-INR    Imaging Review Dg Chest 2 View  03/26/2015  CLINICAL DATA:  Right upper quadrant abdominal pain of 1 month duration. Daily pain and vomiting. EXAM: CHEST  2 VIEW COMPARISON:  04/25/2014 FINDINGS: There is mild unchanged left hemidiaphragm elevation. There is mild chronic interstitial coarsening. There is unchanged mild apical scarring, symmetric. No superimposed acute alveolar infiltrate. Pulmonary vasculature is normal. There is no effusion. The hilar and mediastinal contours are unremarkable unchanged. IMPRESSION: Mild chronic appearing interstitial coarsening. No acute cardiopulmonary findings. Electronically Signed   By: Ellery Plunk M.D.   On: 03/26/2015 01:07   I have personally reviewed and evaluated these images and lab results as part of my medical decision-making.  EKG Interpretation   Date/Time:  Sunday March 26 2015 01:24:13 EDT Ventricular Rate:  90 PR Interval:  150 QRS Duration: 104 QT Interval:  427 QTC Calculation: 522 R Axis:   29 Text Interpretation:  Sinus rhythm Low voltage, extremity and precordial  leads Abnormal R-wave progression, early transition Minimal ST depression,  lateral leads Prolonged QT interval Nonspecific ST and T wave abnormality  Confirmed by Jazelle Achey, MD, Aleksa Collinsworth ([id number]) on 03/26/2015 1:48:47 AM        MDM   Final diagnoses:  RUQ abdominal pain  Atypical chest pain    2:55 AM Pt is pain free at the moment. We discussed with her that lab results are normal. EKG is not showing any acute changes. However we are concerned about the atypical midsternal and right sided CP which intermittently radiates to the back, and we have asked her to see a cardiologist for further evaluation. For her RUQ pain, we have requested that she continues to see her PCP  and her GI doctor. Pt is in agreement with the plan. Strict ER return precautions have been discussed, and she agrees.    I personally performed the services described in this documentation, which was scribed in my presence. The recorded information has been reviewed and is accurate.  Pt comes in with cc of RUQ abd pain and atypical chest pain is added on to her complain (as an associated symptoms). Her abd pain is intermittent, not related to po intake and currently she has mild pain with mild tenderness on exam. Pain not out of proportion to the exam. She also mentions intermittent chest discomfort - usually with the RUQ pain that occasionally will radiate to the mid back region. DDX considered - mesenteric ischemia, ACS, biliary dyskinesia, dissection, esophageal spasm, PUD. Trops ordered and neg. Pt's vascular exam is fine. Currently she has no chest discomfort. She has no neuro deficits on exam and denies any focal neuro complains. Pf course the extensive smoking hx is concerning. Plan was to get trops x 2, but few hours into stay pt requested to be sent home. She had no chest pain andher initial trop draw was 3 hours into her ER visit - so i think d/c is ok. Strongly urged her to see Cardiologist as an outpatient for this vague chest discomfort. EKG currently is reassuring.  Strict ER return precautions have been discussed, and patient is agreeing with the plan and is comfortable with the workup done and the recommendations from the ER.   Christine KaplanAnkit Neda Willenbring, MD 03/26/15 [phone]

## 2015-03-26 ENCOUNTER — Emergency Department (HOSPITAL_COMMUNITY): Payer: Medicare Other

## 2015-03-26 DIAGNOSIS — R1011 Right upper quadrant pain: Secondary | ICD-10-CM | POA: Diagnosis not present

## 2015-03-26 LAB — COMPREHENSIVE METABOLIC PANEL
ALBUMIN: 3.2 g/dL — AB (ref 3.5–5.0)
ALK PHOS: 116 U/L (ref 38–126)
ALT: 10 U/L — AB (ref 14–54)
AST: 20 U/L (ref 15–41)
Anion gap: 13 (ref 5–15)
BILIRUBIN TOTAL: 0.2 mg/dL — AB (ref 0.3–1.2)
BUN: 6 mg/dL (ref 6–20)
CALCIUM: 9.2 mg/dL (ref 8.9–10.3)
CO2: 26 mmol/L (ref 22–32)
CREATININE: 0.87 mg/dL (ref 0.44–1.00)
Chloride: 98 mmol/L — ABNORMAL LOW (ref 101–111)
GFR calc non Af Amer: >60 mL/min (ref >60)
GLUCOSE: 116 mg/dL — AB (ref 65–99)
Potassium: 3.3 mmol/L — ABNORMAL LOW (ref 3.5–5.1)
SODIUM: 137 mmol/L (ref 135–145)
Total Protein: 6.8 g/dL (ref 6.5–8.1)

## 2015-03-26 LAB — CBC WITH DIFFERENTIAL/PLATELET
Basophils Absolute: 0.1 10*3/uL (ref 0.0–0.1)
Basophils Relative: 1 %
EOS ABS: 0.2 10*3/uL (ref 0.0–0.7)
Eosinophils Relative: 3 %
HEMATOCRIT: 39.9 % (ref 36.0–46.0)
HEMOGLOBIN: 13.3 g/dL (ref 12.0–15.0)
LYMPHS ABS: 2.3 10*3/uL (ref 0.7–4.0)
Lymphocytes Relative: 24 %
MCH: 30.9 pg (ref 26.0–34.0)
MCHC: 33.3 g/dL (ref 30.0–36.0)
MCV: 92.8 fL (ref 78.0–100.0)
Monocytes Absolute: 0.9 10*3/uL (ref 0.1–1.0)
Monocytes Relative: 9 %
NEUTROS ABS: 6.1 10*3/uL (ref 1.7–7.7)
NEUTROS PCT: 63 %
Platelets: 311 10*3/uL (ref 150–400)
RBC: 4.3 MIL/uL (ref 3.87–5.11)
RDW: 14.3 % (ref 11.5–15.5)
WBC: 9.6 10*3/uL (ref 4.0–10.5)

## 2015-03-26 LAB — LIPASE, BLOOD: Lipase: 22 U/L (ref 11–51)

## 2015-03-26 LAB — PROTIME-INR
INR: 1.11 (ref 0.00–1.49)
Prothrombin Time: 14.5 seconds (ref 11.6–15.2)

## 2015-03-26 LAB — TROPONIN I: Troponin I: <0.03 ng/mL (ref <0.031)

## 2015-03-26 NOTE — Discharge Instructions (Signed)
We saw you in the ER for the chest pain and abdominal pain.. All of our cardiac workup is normal, including labs, EKG and chest X-RAY are normal. We are not sure what is causing your discomfort, but we feel comfortable sending you home at this time.  The workup in the ER is not complete, and you should follow up with CARDIOLOGIST as requested for the chest and back pain.  You should see the GI doctor for the abdominal pain.  Please return to the ER if you have worsening chest pain, shortness of breath, pain radiating to your jaw, shoulder, or back, sweats or fainting. Otherwise see the Cardiologist or your primary care doctor as requested.    Nonspecific Chest Pain It is often hard to find the cause of chest pain. There is always a chance that your pain could be related to something serious, such as a heart attack or a blood clot in your lungs. Chest pain can also be caused by conditions that are not life-threatening. If you have chest pain, it is very important to follow up with your doctor.  HOME CARE  If you were prescribed an antibiotic medicine, finish it all even if you start to feel better.  Avoid any activities that cause chest pain.  Do not use any tobacco products, including cigarettes, chewing tobacco, or electronic cigarettes. If you need help quitting, ask your doctor.  Do not drink alcohol.  Take medicines only as told by your doctor.  Keep all follow-up visits as told by your doctor. This is important. This includes any further testing if your chest pain does not go away.  Your doctor may tell you to keep your head raised (elevated) while you sleep.  Make lifestyle changes as told by your doctor. These may include:  Getting regular exercise. Ask your doctor to suggest some activities that are safe for you.  Eating a heart-healthy diet. Your doctor or a diet specialist (dietitian) can help you to learn healthy eating options.  Maintaining a healthy weight.  Managing  diabetes, if necessary.  Reducing stress. GET HELP IF:  Your chest pain does not go away, even after treatment.  You have a rash with blisters on your chest.  You have a fever. GET HELP RIGHT AWAY IF:  Your chest pain is worse.  You have an increasing cough, or you cough up blood.  You have severe belly (abdominal) pain.  You feel extremely weak.  You pass out (faint).  You have chills.  You have sudden, unexplained chest discomfort.  You have sudden, unexplained discomfort in your arms, back, neck, or jaw.  You have shortness of breath at any time.  You suddenly start to sweat, or your skin gets clammy.  You feel nauseous.  You vomit.  You suddenly feel light-headed or dizzy.  Your heart begins to beat quickly, or it feels like it is skipping beats. These symptoms may be an emergency. Do not wait to see if the symptoms will go away. Get medical help right away. Call your local emergency services (911 in the [place] Do not drive yourself to the hospital.   This information is not intended to replace advice given to you by your health care provider. Make sure you discuss any questions you have with your health care provider.   Document Released: 06/12/2007 Document Revised: 01/14/2014 Document Reviewed: 07/30/2013 Elsevier Interactive Patient Education 2016 Elsevier Inc. Abdominal Pain, Adult Many things can cause abdominal pain. Usually, abdominal pain is not caused  by a disease and will improve without treatment. It can often be observed and treated at home. Your health care provider will do a physical exam and possibly order blood tests and X-rays to help determine the seriousness of your pain. However, in many cases, more time must pass before a clear cause of the pain can be found. Before that point, your health care provider may not know if you need more testing or further treatment. HOME CARE INSTRUCTIONS Monitor your abdominal pain for any changes. The following  actions may help to alleviate any discomfort you are experiencing:  Only take over-the-counter or prescription medicines as directed by your health care provider.  Do not take laxatives unless directed to do so by your health care provider.  Try a clear liquid diet (broth, tea, or water) as directed by your health care provider. Slowly move to a bland diet as tolerated. SEEK MEDICAL CARE IF:  You have unexplained abdominal pain.  You have abdominal pain associated with nausea or diarrhea.  You have pain when you urinate or have a bowel movement.  You experience abdominal pain that wakes you in the night.  You have abdominal pain that is worsened or improved by eating food.  You have abdominal pain that is worsened with eating fatty foods.  You have a fever. SEEK IMMEDIATE MEDICAL CARE IF:  Your pain does not go away within 2 hours.  You keep throwing up (vomiting).  Your pain is felt only in portions of the abdomen, such as the right side or the left lower portion of the abdomen.  You pass bloody or black tarry stools. MAKE SURE YOU:  Understand these instructions.  Will watch your condition.  Will get help right away if you are not doing well or get worse.   This information is not intended to replace advice given to you by your health care provider. Make sure you discuss any questions you have with your health care provider.   Document Released: 10/03/2004 Document Revised: 09/14/2014 Document Reviewed: 09/02/2012 Elsevier Interactive Patient Education [employer]2016 Elsevier Inc.

## 2015-03-26 NOTE — ED Notes (Signed)
Pt left with all her belongings and ambulated out of the treatment area.  

## 2015-04-05 ENCOUNTER — Encounter: Payer: Self-pay | Admitting: Cardiovascular Disease

## 2015-04-05 ENCOUNTER — Ambulatory Visit (INDEPENDENT_AMBULATORY_CARE_PROVIDER_SITE_OTHER): Payer: Medicare Other | Admitting: Cardiovascular Disease

## 2015-04-05 VITALS — BP 130/78 | HR 80 | Ht 61.0 in | Wt 116.2 lb

## 2015-04-05 DIAGNOSIS — F17200 Nicotine dependence, unspecified, uncomplicated: Secondary | ICD-10-CM

## 2015-04-05 DIAGNOSIS — R079 Chest pain, unspecified: Secondary | ICD-10-CM | POA: Diagnosis not present

## 2015-04-05 DIAGNOSIS — E785 Hyperlipidemia, unspecified: Secondary | ICD-10-CM | POA: Diagnosis not present

## 2015-04-05 DIAGNOSIS — I25118 Atherosclerotic heart disease of native coronary artery with other forms of angina pectoris: Secondary | ICD-10-CM

## 2015-04-05 NOTE — Progress Notes (Signed)
Patient ID: Christine Rogers, female   DOB: 1940-03-08, 75 y.o.   MRN: [id number]    Cardiology Office Note    Date:  04/06/2015   ID:  Christine Rogers, Christine Rogers [birth year], MRN [id number]  PCP:  [name], DO  Cardiologist:   Thurmon Fair, MD   Chief Complaint  Patient presents with  . New Patient (Initial Visit)    no chest pain, no shortness of breath, no edema, no pain or cramping in legs, no lightheaded or dizziness  . Establish Care    reason for visit: atypical chet pain, RUQ abdominal pain    History of Present Illness:  Christine Rogers is a 75 y.o. female with a history of nonobstructive coronary artery disease by previous cardiac catheterization, long history of smoking is empty with complaints of exertional chest discomfort. She also has prominent gastrointestinal complaints and has "nausea and vomiting every day". The symptoms improved after she started taking excellent. She continues to describe a rather atypical sharp discomfort in her right anterior chest that radiates to the retrosternal area. This discomfort consistently happens when she is walking and resolves with rest. She has had her gallbladder evaluated and no abnormalities were found.  Physical activity is very limited due to chronic back pain. She underwent back fusion in 2001. She is on chronic narcotic analgesics. Her right hip and right leg are very weak and she is unable to walk for more than 5 or 10 minutes at a time. Her cholesterol medications were stopped due to this weakness, but she has not noticed any improvement. She has a history of 50 years of one pack per day smoking and has been unable to quit smoking even using nicotine replacement therapy. She does not have diabetes mellitus.  Her family history is strongly positive for premature coronary disease (father age 53, brother age 20 as well as 2 other brothers with coronary problems in her 60s).  Coronary angiography performed in 2002 showed a 50-70% mid LAD  stenosis and minor disease in the first diagonal branch without other significant obstructive lesions and with preserved left ventricular systolic function. Medical therapy was recommended.    Past Medical History  Diagnosis Date  . Hypertension   . GERD (gastroesophageal reflux disease)   . Bipolar 1 disorder (HCC)   . High cholesterol   . DDD (degenerative disc disease), lumbar   . Depression   . Sleep apnea     Past Surgical History  Procedure Laterality Date  . Abdominal hysterectomy    . Back surgery    . Tonsillectomy    . Esophagogastroduodenoscopy (egd) with propofol N/A 01/23/2015    RMR: abnormal gastric mucosa.pyloric channel of uncertain significance status post biopys  . Biopsy  01/23/2015    Procedure: BIOPSY;  Surgeon: Corbin Ade, MD;  Location: [facility];  Service: Endoscopy;;  gastric biopsies    Current Medications: Outpatient Prescriptions Prior to Visit  Medication Sig Dispense Refill  . amLODipine (NORVASC) 2.5 MG tablet Take 2.5 mg by mouth every evening.    . benzonatate (TESSALON) 100 MG capsule Take 100 mg by mouth 3 (three) times daily as needed for cough.    . dexlansoprazole (DEXILANT) 60 MG capsule Take 1 capsule (60 mg total) by mouth daily. 30 capsule 11  . lisinopril (PRINIVIL,ZESTRIL) 2.5 MG tablet Take 2.5 mg by mouth every evening.    . ondansetron (ZOFRAN) 4 MG tablet Take 4 mg by mouth every 8 (eight) hours as needed for  nausea or vomiting.     Marland Kitchen oxycodone (ROXICODONE) 30 MG immediate release tablet Take 30 mg by mouth 4 (four) times daily as needed. FOR PAIN    . OXYCONTIN 80 MG T12A 12 hr tablet Take 40 mg by mouth every 12 (twelve) hours.     Marland Kitchen venlafaxine XR (EFFEXOR-XR) 75 MG 24 hr capsule Take 75 mg by mouth every evening.    . Linaclotide (LINZESS) 145 MCG CAPS capsule Take 1 capsule (145 mcg total) by mouth daily. On empty stomach (Patient not taking: Reported on 04/05/2015) 30 capsule 5   No facility-administered medications  prior to visit.     Allergies:   Imitrex; Ciprofloxacin; and Meperidine hcl   Social History   Social History  . Marital Status: Divorced    Spouse Name: N/A  . Number of Children: N/A  . Years of Education: N/A   Social History Main Topics  . Smoking status: Current Some Day Smoker -- 1.00 packs/day    Types: Cigarettes  . Smokeless tobacco: None  . Alcohol Use: No  . Drug Use: No  . Sexual Activity: Not Asked   Other Topics Concern  . None   Social History Narrative     Family History:  The patient's family history includes Heart disease in her father. There is no history of Colon cancer.   ROS:   Please see the history of present illness.    ROS All other systems reviewed and are negative.   PHYSICAL EXAM:   VS:  BP 130/78 mmHg  Pulse 80  Ht  (1.549 m)  Wt 52.731 kg (116 lb 4 oz)  BMI 21.98 kg/m2   GEN: Well nourished, well developed, in no acute distress HEENT: normal Neck: no JVD, carotid bruits, or masses Cardiac: RRR; no murmurs, rubs, or gallops,no edema  Respiratory:  clear to auscultation bilaterally, normal work of breathing GI: soft, nontender, nondistended, + BS MS: no deformity or atrophy Skin: warm and dry, no rash Neuro:  Alert and Oriented x 3, Strength and sensation are intact Psych: euthymic mood, full affect  Wt Readings from Last 3 Encounters:  04/05/15 52.731 kg (116 lb 4 oz)  03/08/15 52.799 kg (116 lb 6.4 oz)  01/20/15 53.524 kg (118 lb)      Studies/Labs Reviewed:   EKG:  EKG is Not ordered today.  The ekg 03/28/2015 today demonstrates normal sinus rhythm with diffuse and mild nonspecific T-wave changes  Recent Labs: 04/25/2014: B Natriuretic Peptide 9.8 03/26/2015: ALT 10*; BUN 6; Creatinine, Ser 0.87; Hemoglobin 13.3; Platelets 311; Potassium 3.3*; Sodium 137   Lipid Panel No results found for: CHOL, TRIG, HDL, CHOLHDL, VLDL, LDLCALC, LDLDIRECT   ASSESSMENT:    1. Chest pain on exertion   2. Atherosclerosis of  native coronary artery of native heart with other form of angina pectoris (HCC)   3. TOBACCO ABUSE   4. Dyslipidemia      PLAN:  In order of problems listed above:  1. Exertional angina pectoris: The quality and location of her chest symptoms are quite atypical, but there is very striking association with physical activity and resolution with rest. Her symptoms are compatible with stable angina pectoris and there are no high-risk features on her electrocardiogram. It is highly likely that she has progression of coronary disease in the 15 years that have passed since her coronary angiogram with continued heavy smoking. She is already receiving treatment with a low dose of amlodipine, but may not tolerate beta blockers  due to reactive airway disease. I think proceeding directly to coronary angiography would be quite reasonable, she is reluctant to undergo invasive procedures. We'll start with a myocardial perfusion study, Pharmacological study since she cannot really walk on a treadmill due to her back problems.. If the area of ischemia is wide, especially if it involves the anterior wall, she should definitely undergo cardiac catheterization. If there is not a big area of ischemia, we'll try antianginal therapy with long-acting nitrates and/or higher doses of amlodipine and/or Ranexa. I told her that if her stress test is abnormal, I would refer cardiac catheterization to one of my interventional cardiology colleagues. 2. CAD: She had moderate stenosis in the LAD artery of previous cardiac catheterization. 3. Smoking cessation would be critically important to avoid worsening of heart disease and lung problems, but she does not think she will ever be successful in stopping smoking. 4. HLP: Need to get updated lipid profile. I think it is more likely that her leg weakness is related to her chronic spine problems rather than statin side effects. 5. HTN: Well controlled. Prefer higher doses of calcium  channel blocker for their antianginal effect, rather than using the ACE inhibitor.    Medication Adjustments/Labs and Tests Ordered: Current medicines are reviewed at length with the patient today.  Concerns regarding medicines are outlined above.  Medication changes, Labs and Tests ordered today are listed in the Patient Instructions below. Patient Instructions  Dr Royann Shiversroitoru recommends that you continue on your current medications as directed. Please refer to the Current Medication list given to you today.  Your physician has requested that you have a lexiscan myoview. For further information please visit [email or web address]. Please follow instruction sheet, as given.  Dr Royann Shiversroitoru recommends that you follow-up with him as needed.    Joie BimlerSigned, Quillan Whitter, MD  04/06/2015 4:03 PM    [facility] [address], [place], [place]  [address] Phone: [phone]; Fax: [phone]

## 2015-04-05 NOTE — Patient Instructions (Signed)
Dr Croitoru recommends that you continue on your current medications as directed. Please refer to the Current Medication list given to you today.  Your physician has requested that you have a lexiscan myoview. For further information please visit [email or web address]. Please follow instruction sheet, as given.  Dr Croitoru recommends that you follow-up with him as needed. 

## 2015-04-06 DIAGNOSIS — R079 Chest pain, unspecified: Secondary | ICD-10-CM | POA: Insufficient documentation

## 2015-04-07 ENCOUNTER — Encounter: Payer: Self-pay | Admitting: Cardiovascular Disease

## 2015-04-10 ENCOUNTER — Telehealth: Payer: Self-pay

## 2015-04-10 NOTE — Telephone Encounter (Signed)
HIDA with CCK. F/u OV in 2-3 weeks with rmr.

## 2015-04-10 NOTE — Telephone Encounter (Signed)
Pt called- she is still having problems especially with the Upper Abd pain. She said she has been sick all weekend. Per U/S note- Verlon AuLeslie had wanted HIDA if pt is not better. Pt said it was ok to schedule if LSL wanted her to have it done.   Verlon AuLeslie, do you want pt to have this done?

## 2015-04-11 ENCOUNTER — Other Ambulatory Visit: Payer: Self-pay

## 2015-04-11 DIAGNOSIS — R1010 Upper abdominal pain, unspecified: Secondary | ICD-10-CM

## 2015-04-11 NOTE — Telephone Encounter (Signed)
Christine Rogers, pt said it was ok to schedule this if LSL wanted it done. Please schedule. Her number is [phone]. Stacey, please schedule ov with RMR.

## 2015-04-11 NOTE — Telephone Encounter (Signed)
Pt is set for HIDA is on 04/13/2015 @ 0800am.    Pt has OV appt with RMR on 05/02/2015 @ 1000am.  Pt is aware of all these appointments.

## 2015-04-13 ENCOUNTER — Encounter (HOSPITAL_COMMUNITY)
Admission: RE | Admit: 2015-04-13 | Discharge: 2015-04-13 | Disposition: A | Discharge status: Home / Self Care | Payer: Medicare Other | Source: Ambulatory Visit | Attending: Gastroenterology | Admitting: Gastroenterology

## 2015-04-13 DIAGNOSIS — R1010 Upper abdominal pain, unspecified: Secondary | ICD-10-CM

## 2015-04-17 ENCOUNTER — Encounter (HOSPITAL_COMMUNITY): Payer: Medicare Other

## 2015-04-21 ENCOUNTER — Inpatient Hospital Stay (HOSPITAL_COMMUNITY): Admission: RE | Admit: 2015-04-21 | Payer: Medicare Other | Source: Ambulatory Visit

## 2015-04-24 ENCOUNTER — Encounter (HOSPITAL_COMMUNITY): Payer: Self-pay

## 2015-04-24 ENCOUNTER — Ambulatory Visit (HOSPITAL_COMMUNITY)
Admission: RE | Admit: 2015-04-24 | Discharge: 2015-04-24 | Disposition: A | Discharge status: Home / Self Care | Payer: Medicare Other | Source: Ambulatory Visit | Attending: Gastroenterology | Admitting: Gastroenterology

## 2015-04-24 DIAGNOSIS — R112 Nausea with vomiting, unspecified: Secondary | ICD-10-CM | POA: Diagnosis not present

## 2015-04-24 DIAGNOSIS — R05 Cough: Secondary | ICD-10-CM | POA: Diagnosis not present

## 2015-04-24 DIAGNOSIS — R1010 Upper abdominal pain, unspecified: Secondary | ICD-10-CM | POA: Diagnosis present

## 2015-04-24 MED ORDER — TECHNETIUM TC 99M MEBROFENIN IV KIT
5.0000 | PACK | Freq: Once | INTRAVENOUS | Status: AC | PRN
  Administered 2015-04-24: 5 via INTRAVENOUS

## 2015-04-26 NOTE — Progress Notes (Signed)
Quick Note:  Please let patient know her HIDA was normal. Keep OV with RMR as scheduled to discuss next step. ______

## 2015-05-02 ENCOUNTER — Ambulatory Visit (INDEPENDENT_AMBULATORY_CARE_PROVIDER_SITE_OTHER): Payer: Medicare Other | Admitting: Internal Medicine

## 2015-05-02 ENCOUNTER — Encounter: Payer: Self-pay | Admitting: Internal Medicine

## 2015-05-02 VITALS — BP 127/74 | HR 86 | Temp 96.7°F | Ht 61.0 in | Wt 114.6 lb

## 2015-05-02 DIAGNOSIS — R1011 Right upper quadrant pain: Principal | ICD-10-CM

## 2015-05-02 DIAGNOSIS — G8929 Other chronic pain: Secondary | ICD-10-CM

## 2015-05-02 DIAGNOSIS — K219 Gastro-esophageal reflux disease without esophagitis: Secondary | ICD-10-CM | POA: Diagnosis not present

## 2015-05-02 DIAGNOSIS — K5909 Other constipation: Secondary | ICD-10-CM

## 2015-05-02 DIAGNOSIS — R1010 Upper abdominal pain, unspecified: Secondary | ICD-10-CM | POA: Diagnosis not present

## 2015-05-02 NOTE — Progress Notes (Signed)
Primary Care Physician:  Samuel JesterYNTHIA BUTLER, DO Primary Gastroenterologist:  Dr. Jena Gaussourk  Pre-Procedure History & Physical: HPI:  Achilles DunkBarbara H Rogers is a 75 y.o. female here for follow-up. GERD under good control with Dexilant 60 mg daily. Right upper quadrant bowel pain has lessened with effective management of constipation. Uses Movantik when necessary. 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 Gallbladder ultrasound HIDA and LFTs all came back good. Patient is pleased with her progress; she is doing very well. Going to see Dr. Salena Saner  next week for a cardiac evaluation. Awake essentially unchanged.  Past Medical History  Diagnosis Date  . Hypertension   . GERD (gastroesophageal reflux disease)   . Bipolar 1 disorder (HCC)   . High cholesterol   . DDD (degenerative disc disease), lumbar   . Depression   . Sleep apnea     Past Surgical History  Procedure Laterality Date  . Abdominal hysterectomy    . Back surgery    . Tonsillectomy    . Esophagogastroduodenoscopy (egd) with propofol N/A 01/23/2015    RMR: abnormal gastric mucosa.pyloric channel of uncertain significance status post biopys  . Biopsy  01/23/2015    Procedure: BIOPSY;  Surgeon: Corbin Adeobert M Tyronn Golda, MD;  Location: [facility];  Service: Endoscopy;;  gastric biopsies    Prior to Admission medications   Medication Sig Start Date End Date Taking? Authorizing Provider  amLODipine (NORVASC) 2.5 MG tablet Take 2.5 mg by mouth every evening.   Yes Historical Provider, MD  benzonatate (TESSALON) 100 MG capsule Take 100 mg by mouth 3 (three) times daily as needed for cough.   Yes Historical Provider, MD  dexlansoprazole (DEXILANT) 60 MG capsule Take 1 capsule (60 mg total) by mouth daily. 02/21/15  Yes Corbin Adeobert M Emmert Roethler, MD  lisinopril (PRINIVIL,ZESTRIL) 2.5 MG tablet Take 2.5 mg by mouth every evening.   Yes Historical Provider, MD  naloxegol oxalate (MOVANTIK) 12.5 MG TABS tablet Take 12.5 mg by mouth daily.   Yes Historical Provider, MD    ondansetron (ZOFRAN) 4 MG tablet Take 4 mg by mouth every 8 (eight) hours as needed for nausea or vomiting.  12/24/14  Yes Historical Provider, MD  oxycodone (ROXICODONE) 30 MG immediate release tablet Take 30 mg by mouth 4 (four) times daily as needed. FOR PAIN 04/08/14  Yes Historical Provider, MD  OXYCONTIN 80 MG T12A 12 hr tablet Take 40 mg by mouth every 12 (twelve) hours.  03/27/14  Yes Historical Provider, MD  venlafaxine XR (EFFEXOR-XR) 75 MG 24 hr capsule Take 75 mg by mouth every evening.   Yes Historical Provider, MD    Allergies as of 05/02/2015 - Review Complete 04/24/2015  Allergen Reaction Noted  . Imitrex [sumatriptan] Anaphylaxis and Swelling 04/10/2014  . Ciprofloxacin    . Meperidine hcl Nausea And Vomiting     Family History  Problem Relation Age of Onset  . Heart disease Father   . Heart attack Father   . Colon cancer Neg Hx   . Heart attack Brother   . Heart Problems Brother   . Heart attack Brother   . Cancer Child     Social History   Social History  . Marital Status: Divorced    Spouse Name: N/A  . Number of Children: N/A  . Years of Education: N/A   Occupational History  . Not on file.   Social History Main Topics  . Smoking status: Current Some Day Smoker -- 1.00 packs/day    Types: Cigarettes  .  Smokeless tobacco: Not on file  . Alcohol Use: No  . Drug Use: No  . Sexual Activity: Not on file   Other Topics Concern  . Not on file   Social History Narrative    Review of Systems: See HPI, otherwise negative ROS  Physical Exam: BP 127/74 mmHg  Pulse 86  Temp(Src) 96.7 F (35.9 C)  Ht  (1.549 m)  Wt 114 lb 9.6 oz (51.982 kg)  BMI 21.66 kg/m2 General:   Alert,  Well-developed, well-nourished, pleasant and cooperative in NAD Skin:  Intact without significant lesions or rashes. Neck:  Supple; no masses or thyromegaly. No significant cervical adenopathy. Lungs:  Clear throughout to auscultation.   No wheezes, crackles, or rhonchi.  No acute distress. Heart:  Regular rate and rhythm; no murmurs, clicks, rubs,  or gallops. Abdomen: Non-distended, normal bowel sounds.  Soft and nontender without appreciable mass or hepatosplenomegaly.  Pulses:  Normal pulses noted. Extremities:  Without clubbing or edema.  Impression:  GERD doing very well on Dexilant 60 mg daily. Right upper quadrant abdominal pain has improved considerably. Colonic interposition may or may not have anything to do with her symptoms. Constipation well managed with when necessary Movantik.Overall doing well. No further GI evaluation needed at this time.  Recommendations:  Continue Dexilant daily  Use Movantik as needed  Office visit in 1 year  Keep appointments with Drs. Butler and C.  Call in the interim if any GI issues arise      Notice: This dictation was prepared with Dragon dictation along with smaller phrase technology. Any transcriptional errors that result from this process are unintentional and may not be corrected upon review.

## 2015-05-02 NOTE — Patient Instructions (Signed)
Continue Dexilant daily  Use Movantik as needed  Office visit in 1 year  Keep appointments with Drs. Butler and C.  Call in the interim if any GI problems

## 2015-05-04 ENCOUNTER — Telehealth (HOSPITAL_COMMUNITY): Payer: Self-pay

## 2015-05-04 NOTE — Telephone Encounter (Signed)
Encounter complete. 

## 2015-05-05 ENCOUNTER — Ambulatory Visit: Payer: Medicare Other | Admitting: Cardiovascular Disease

## 2015-05-09 ENCOUNTER — Ambulatory Visit (HOSPITAL_COMMUNITY)
Admission: RE | Admit: 2015-05-09 | Discharge: 2015-05-09 | Disposition: A | Discharge status: Home / Self Care | Payer: Medicare Other | Source: Ambulatory Visit | Attending: Cardiovascular Disease | Admitting: Cardiovascular Disease

## 2015-05-09 DIAGNOSIS — Z720 Tobacco use: Secondary | ICD-10-CM | POA: Insufficient documentation

## 2015-05-09 DIAGNOSIS — R079 Chest pain, unspecified: Secondary | ICD-10-CM | POA: Insufficient documentation

## 2015-05-09 DIAGNOSIS — I10 Essential (primary) hypertension: Secondary | ICD-10-CM | POA: Insufficient documentation

## 2015-05-09 DIAGNOSIS — Z8249 Family history of ischemic heart disease and other diseases of the circulatory system: Secondary | ICD-10-CM | POA: Insufficient documentation

## 2015-05-09 LAB — MYOCARDIAL PERFUSION IMAGING
CHL CUP RESTING HR STRESS: 68 {beats}/min
LVDIAVOL: 73 mL (ref 46–106)
LVSYSVOL: 29 mL
NUC STRESS TID: 0.9
Peak HR: 141 {beats}/min
SDS: 3
SRS: 4
SSS: 7

## 2015-05-09 MED ORDER — REGADENOSON 0.4 MG/5ML IV SOLN
0.4000 mg | Freq: Once | INTRAVENOUS | Status: AC
  Administered 2015-05-09: 0.4 mg via INTRAVENOUS

## 2015-05-09 MED ORDER — TECHNETIUM TC 99M SESTAMIBI GENERIC - CARDIOLITE
10.1000 | Freq: Once | INTRAVENOUS | Status: AC | PRN
  Administered 2015-05-09: 10.1 via INTRAVENOUS

## 2015-05-09 MED ORDER — TECHNETIUM TC 99M SESTAMIBI GENERIC - CARDIOLITE
31.0000 | Freq: Once | INTRAVENOUS | Status: AC | PRN
  Administered 2015-05-09: 31 via INTRAVENOUS

## 2015-05-09 MED ORDER — AMINOPHYLLINE 25 MG/ML IV SOLN
75.0000 mg | Freq: Once | INTRAVENOUS | Status: AC
  Administered 2015-05-09: 75 mg via INTRAVENOUS

## 2015-05-15 ENCOUNTER — Telehealth: Payer: Self-pay | Admitting: Cardiovascular Disease

## 2015-05-15 ENCOUNTER — Ambulatory Visit: Payer: Medicare Other | Admitting: Cardiovascular Disease

## 2015-05-19 NOTE — Telephone Encounter (Signed)
Closed Encounter  °

## 2015-05-30 ENCOUNTER — Encounter: Payer: Self-pay | Admitting: Cardiovascular Disease

## 2015-05-30 ENCOUNTER — Ambulatory Visit (INDEPENDENT_AMBULATORY_CARE_PROVIDER_SITE_OTHER): Payer: Medicare Other | Admitting: Cardiovascular Disease

## 2015-05-30 VITALS — BP 117/75 | HR 80 | Ht 60.0 in | Wt 115.2 lb

## 2015-05-30 DIAGNOSIS — Z79899 Other long term (current) drug therapy: Secondary | ICD-10-CM | POA: Diagnosis not present

## 2015-05-30 DIAGNOSIS — E785 Hyperlipidemia, unspecified: Secondary | ICD-10-CM | POA: Diagnosis not present

## 2015-05-30 MED ORDER — ISOSORBIDE MONONITRATE ER 30 MG PO TB24: 30.0000 mg | ORAL_TABLET | Freq: Every day | ORAL | Status: DC

## 2015-05-30 MED ORDER — ATORVASTATIN CALCIUM 20 MG PO TABS: 20.0000 mg | ORAL_TABLET | Freq: Every day | ORAL | Status: DC

## 2015-05-30 NOTE — Progress Notes (Signed)
Patient ID: Christine Rogers, female   DOB: 08-Jan-1940, 75 y.o.   MRN: [id number] Patient ID: Christine Rogers, female   DOB: 05/17/1940, 75 y.o.   MRN: [id number]    Cardiology Office Note    Date:  05/30/2015   ID:  Christine Rogers, [name] 01-07-1941, MRN [id number]  PCP:  [name], DO  Cardiologist:   Thurmon Fair, MD   Chief Complaint  Patient presents with  . Follow-up    stress test results    History of Present Illness:  Christine Rogers is a 75 y.o. female with a history of nonobstructive coronary artery disease by previous cardiac catheterization, long history of smoking is empty with complaints of exertional chest discomfort. She also has prominent gastrointestinal complaints and has "nausea and vomiting every day". All these complaints seem to have improved after she started taking Movantik for opioid related constipation. She does describe occasional episodes of hot flushing and diaphoresis, followed by chilliness, which sound like episodes of possible opioid withdrawal.  She continues to have occasional episodes of chest discomfort that is mild and seems to resolve for the most part spontaneously. There is no clear association with physical activity.  Physical activity is very limited due to chronic back pain. She underwent back fusion in 2001. She is on chronic narcotic analgesics. Her right hip and right leg are very weak and she is unable to walk for more than 5 or 10 minutes at a time. Her cholesterol medications were stopped due to this weakness, but she has not noticed any improvement. She has a history of 50 years of one pack per day smoking and has been unable to quit smoking even using nicotine replacement therapy. She does not have diabetes mellitus.  Her family history is strongly positive for premature coronary disease (father age 29, brother age 92 as well as 2 other brothers with coronary problems in her 26s).  Coronary angiography performed in 2002 showed a 50-70%  mid LAD stenosis and minor disease in the first diagonal branch without other significant obstructive lesions and with preserved left ventricular systolic function. Medical therapy was recommended.  She underwent a nuclear stress test on May 2 which showed low risk findings with preserved EF and no evidence of reversible ischemia.  Past Medical History  Diagnosis Date  . Hypertension   . GERD (gastroesophageal reflux disease)   . Bipolar 1 disorder (HCC)   . High cholesterol   . DDD (degenerative disc disease), lumbar   . Depression   . Sleep apnea     Past Surgical History  Procedure Laterality Date  . Abdominal hysterectomy    . Back surgery    . Tonsillectomy    . Esophagogastroduodenoscopy (egd) with propofol N/A 01/23/2015    RMR: abnormal gastric mucosa.pyloric channel of uncertain significance status post biopys  . Biopsy  01/23/2015    Procedure: BIOPSY;  Surgeon: Corbin Ade, MD;  Location: [facility];  Service: Endoscopy;;  gastric biopsies    Current Medications: Outpatient Prescriptions Prior to Visit  Medication Sig Dispense Refill  . amLODipine (NORVASC) 2.5 MG tablet Take 2.5 mg by mouth every evening.    . benzonatate (TESSALON) 100 MG capsule Take 100 mg by mouth 3 (three) times daily as needed for cough.    . dexlansoprazole (DEXILANT) 60 MG capsule Take 1 capsule (60 mg total) by mouth daily. 30 capsule 11  . lisinopril (PRINIVIL,ZESTRIL) 2.5 MG tablet Take 2.5 mg by mouth every evening.    Marland Kitchen  naloxegol oxalate (MOVANTIK) 12.5 MG TABS tablet Take 12.5 mg by mouth daily.    . ondansetron (ZOFRAN) 4 MG tablet Take 4 mg by mouth every 8 (eight) hours as needed for nausea or vomiting.     Marland Kitchen. oxycodone (ROXICODONE) 30 MG immediate release tablet Take 30 mg by mouth 4 (four) times daily as needed. FOR PAIN    . OXYCONTIN 80 MG T12A 12 hr tablet Take 80 mg by mouth every 12 (twelve) hours.     Marland Kitchen. venlafaxine XR (EFFEXOR-XR) 75 MG 24 hr capsule Take 75 mg by mouth  every evening.     No facility-administered medications prior to visit.     Allergies:   Imitrex; Ciprofloxacin; and Demerol   Social History   Social History  . Marital Status: Divorced    Spouse Name: N/A  . Number of Children: N/A  . Years of Education: N/A   Social History Main Topics  . Smoking status: Current Some Day Smoker -- 1.00 packs/day    Types: Cigarettes  . Smokeless tobacco: None  . Alcohol Use: No  . Drug Use: No  . Sexual Activity: Not Asked   Other Topics Concern  . None   Social History Narrative     Family History:  The patient's family history includes Cancer in her child; Heart Problems in her brother; Heart attack in her brother, brother, and father; Heart disease in her father. There is no history of Colon cancer.   ROS:   Please see the history of present illness.    ROS All other systems reviewed and are negative.   PHYSICAL EXAM:   VS:  BP 117/75 mmHg  Pulse 80  Ht 5' (1.524 m)  Wt 52.254 kg (115 lb 3.2 oz)  BMI 22.50 kg/m2  SpO2 95%   GEN: Well nourished, well developed, in no acute distress HEENT: normal Neck: no JVD, carotid bruits, or masses Cardiac: RRR; no murmurs, rubs, or gallops,no edema  Respiratory:  clear to auscultation bilaterally, normal work of breathing GI: soft, nontender, nondistended, + BS MS: no deformity or atrophy Skin: warm and dry, no rash Neuro:  Alert and Oriented x 3, Strength and sensation are intact Psych: euthymic mood, full affect  Wt Readings from Last 3 Encounters:  05/30/15 52.254 kg (115 lb 3.2 oz)  05/09/15 52.617 kg (116 lb)  05/02/15 51.982 kg (114 lb 9.6 oz)      Studies/Labs Reviewed:   EKG:  EKG is Not ordered today.  The ekg 03/28/2015 today demonstrates normal sinus rhythm with diffuse and mild nonspecific T-wave changes  Recent Labs: 03/26/2015: ALT 10*; BUN 6; Creatinine, Ser 0.87; Hemoglobin 13.3; Platelets 311; Potassium 3.3*; Sodium 137   Lipid Panel No results found for:  CHOL, TRIG, HDL, CHOLHDL, VLDL, LDLCALC, LDLDIRECT   ASSESSMENT:    1. Dyslipidemia   2. Medication management      PLAN:  In order of problems listed above:  1. Exertional angina pectoris: Recent low risk nuclear stress test. Known moderate coronary artery disease by previous angiography. She is already receiving treatment with a low dose of amlodipine, but may not tolerate beta blockers due to reactive airway disease. We'll try antianginal therapy with long-acting nitrates with option to switch to higher doses of amlodipine and/or Ranexa if she has headaches or other side effects. 2. CAD: She had moderate stenosis in the LAD artery by previous cardiac catheterization. 3. Smoking cessation would be critically important to avoid worsening of heart disease and lung  problems, but she does not think she will ever be successful in stopping smoking. She also tells me if not to make any difference at her age. I tried to convince her of the contrary, but I don't think she is going to try to quit. 4. HLP: Need to restart statin. I think it is more likely that her leg weakness is related to her chronic spine problems rather than statin side effects. Recheck in 3 months. 5. HTN: Well controlled. Prefer higher doses of calcium channel blocker for their antianginal effect, rather than using the ACE inhibitor.    Medication Adjustments/Labs and Tests Ordered: Current medicines are reviewed at length with the patient today.  Concerns regarding medicines are outlined above.  Medication changes, Labs and Tests ordered today are listed in the Patient Instructions below. Patient Instructions  Dr Royann Shivers has recommended making the following medication changes: 1. START Atorvastatin 20 mg - take 1 tablet by mouth once daily 2. START Isosorbide Mononitrate 30 mg - take 1 tablet by mouth once daily  Your physician recommends that you return for lab work in 2.5 months - FASTING.  Dr Royann Shivers recommends that  you schedule a follow-up appointment in 3 months.  If you need a refill on your cardiac medications before your next appointment, please call your pharmacy.     Signed, Thurmon Fair, MD  05/30/2015 6:00 PM    [facility] [address] [place], [place], [place]  [address] Phone: [phone]; Fax: [phone]

## 2015-05-30 NOTE — Patient Instructions (Signed)
Dr Royann Shiversroitoru has recommended making the following medication changes: 1. START Atorvastatin 20 mg - take 1 tablet by mouth once daily 2. START Isosorbide Mononitrate 30 mg - take 1 tablet by mouth once daily  Your physician recommends that you return for lab work in 2.5 months - FASTING.  Dr Royann Shiversroitoru recommends that you schedule a follow-up appointment in 3 months.  If you need a refill on your cardiac medications before your next appointment, please call your pharmacy.

## 2015-07-14 ENCOUNTER — Encounter: Payer: Self-pay | Admitting: Cardiovascular Disease

## 2015-08-31 ENCOUNTER — Ambulatory Visit: Payer: Medicare Other | Admitting: Cardiovascular Disease

## 2015-09-04 ENCOUNTER — Ambulatory Visit: Payer: Medicare Other | Admitting: Cardiovascular Disease

## 2015-09-05 ENCOUNTER — Encounter: Payer: Self-pay | Admitting: *Deleted

## 2015-09-06 ENCOUNTER — Emergency Department (HOSPITAL_COMMUNITY)
Admission: EM | Admit: 2015-09-06 | Discharge: 2015-09-06 | Disposition: A | Discharge status: Home / Self Care | Payer: Medicare Other | Attending: Emergency Medicine | Admitting: Emergency Medicine

## 2015-09-06 ENCOUNTER — Encounter (HOSPITAL_COMMUNITY): Payer: Self-pay | Admitting: Emergency Medicine

## 2015-09-06 DIAGNOSIS — S3992XA Unspecified injury of lower back, initial encounter: Secondary | ICD-10-CM | POA: Diagnosis present

## 2015-09-06 DIAGNOSIS — I10 Essential (primary) hypertension: Secondary | ICD-10-CM | POA: Insufficient documentation

## 2015-09-06 DIAGNOSIS — Y999 Unspecified external cause status: Secondary | ICD-10-CM | POA: Diagnosis not present

## 2015-09-06 DIAGNOSIS — F17210 Nicotine dependence, cigarettes, uncomplicated: Secondary | ICD-10-CM | POA: Diagnosis not present

## 2015-09-06 DIAGNOSIS — S39012A Strain of muscle, fascia and tendon of lower back, initial encounter: Secondary | ICD-10-CM | POA: Diagnosis not present

## 2015-09-06 DIAGNOSIS — Z79899 Other long term (current) drug therapy: Secondary | ICD-10-CM | POA: Insufficient documentation

## 2015-09-06 DIAGNOSIS — W1839XA Other fall on same level, initial encounter: Secondary | ICD-10-CM | POA: Insufficient documentation

## 2015-09-06 DIAGNOSIS — Y92009 Unspecified place in unspecified non-institutional (private) residence as the place of occurrence of the external cause: Secondary | ICD-10-CM | POA: Diagnosis not present

## 2015-09-06 DIAGNOSIS — Y939 Activity, unspecified: Secondary | ICD-10-CM | POA: Diagnosis not present

## 2015-09-06 MED ORDER — DEXAMETHASONE SODIUM PHOSPHATE 4 MG/ML IJ SOLN
10.0000 mg | Freq: Once | INTRAMUSCULAR | Status: AC
  Administered 2015-09-06: 10 mg via INTRAVENOUS
  Filled 2015-09-06: qty 3

## 2015-09-06 MED ORDER — LORAZEPAM 2 MG/ML IJ SOLN
0.5000 mg | Freq: Once | INTRAMUSCULAR | Status: AC
  Administered 2015-09-06: 0.5 mg via INTRAVENOUS
  Filled 2015-09-06: qty 1

## 2015-09-06 MED ORDER — HYDROMORPHONE HCL 1 MG/ML IJ SOLN
1.0000 mg | Freq: Once | INTRAMUSCULAR | Status: AC
  Administered 2015-09-06: 1 mg via INTRAVENOUS
  Filled 2015-09-06: qty 1

## 2015-09-06 MED ORDER — PREDNISONE 20 MG PO TABS: 40.0000 mg | ORAL_TABLET | Freq: Every day | ORAL | 0 refills | Status: DC

## 2015-09-06 MED ORDER — KETOROLAC TROMETHAMINE 30 MG/ML IJ SOLN
15.0000 mg | Freq: Once | INTRAMUSCULAR | Status: AC
Start: 2015-09-06 — End: 2015-09-06
  Administered 2015-09-06: 15 mg via INTRAVENOUS
  Filled 2015-09-06: qty 1

## 2015-09-06 NOTE — ED Triage Notes (Signed)
Per EMS pt fell at home. C/O low mid back pain. Pt has hx of back sx. Pt given 6 mg morphine en route.

## 2015-09-06 NOTE — ED Provider Notes (Signed)
[facility] Provider Note   CSN: [id number] Arrival date & time: 09/06/15  2142  By signing my name below, I, Rosario Adie, attest that this documentation has been prepared under the direction and in the presence of Raeford Razor, MD. Electronically Signed: Rosario Adie, ED Scribe. 09/06/15. 10:32 PM.  History   Chief Complaint Chief Complaint  Patient presents with  . Back Pain   The history is provided by the patient and a relative. No language interpreter was used.    HPI Comments: Christine Rogers is a 75 y.o. female BIB EMS, with a PMHx of DDD, depression, HTN, HLD, Bipolar 1 disorder, and GERD, who presents to the Emergency Department complaining of sudden onset, gradually worsening, constant middle-lower back pain onset ~1 day ago, worsening s/p unwitnessed mechanical ground-level fall that occurred just PTA. Pt reports that she remembers falling, but is unsure of how she fell. Her family notes that they found her on the ground and called EMS. Pt denies LOC or head injury. She notes that her pain radiates down her left leg and into her left foot. Pt reports a hx of similar pain since back fusion surgery ~20 years ago. Her pain is exacerbated with movement. Pt has taken 1 dose of Oxycontin prior to coming in the ED with minimal relief of her pain. She was also given 6mg  Morphine en route with no noted relief of her pain. Pt denies numbness, tingling, bowel/bladder incontinence, fever, dysuria, hematuria, frequency, urgency, or any other associated symptoms.   Past Medical History:  Diagnosis Date  . Bipolar 1 disorder (HCC)   . DDD (degenerative disc disease), lumbar   . Depression   . GERD (gastroesophageal reflux disease)   . High cholesterol   . Hypertension   . Sleep apnea    Patient Active Problem List   Diagnosis Date Noted  . Chest pain on exertion 04/06/2015  . RUQ pain 03/08/2015  . Mucosal abnormality of stomach   . GERD (gastroesophageal reflux  disease) 12/27/2014  . N&V (nausea and vomiting) 12/27/2014  . Weight gain 12/27/2014  . Constipation 12/27/2014  . Dyslipidemia 08/22/2009  . TOBACCO ABUSE 08/22/2009  . ESSENTIAL HYPERTENSION, BENIGN 08/22/2009  . CORONARY ATHEROSCLEROSIS NATIVE CORONARY ARTERY 08/22/2009  . CLAUDICATION 08/22/2009   Past Surgical History:  Procedure Laterality Date  . ABDOMINAL HYSTERECTOMY    . BACK SURGERY    . BIOPSY  01/23/2015   Procedure: BIOPSY;  Surgeon: Corbin Ade, MD;  Location: [facility];  Service: Endoscopy;;  gastric biopsies  . ESOPHAGOGASTRODUODENOSCOPY (EGD) WITH PROPOFOL N/A 01/23/2015   RMR: abnormal gastric mucosa.pyloric channel of uncertain significance status post biopys  . TONSILLECTOMY     OB History    Gravida Para Term Preterm AB Living   5 5 4 1   4    SAB TAB Ectopic Multiple Live Births                 Home Medications    Prior to Admission medications   Medication Sig Start Date End Date Taking? Authorizing Provider  amLODipine (NORVASC) 2.5 MG tablet Take 2.5 mg by mouth every evening.   Yes Historical Provider, MD  atorvastatin (LIPITOR) 20 MG tablet Take 1 tablet (20 mg total) by mouth daily. 05/30/15  Yes Mihai Croitoru, MD  benzonatate (TESSALON) 100 MG capsule Take 100 mg by mouth 3 (three) times daily as needed for cough.   Yes Historical Provider, MD  dexlansoprazole (DEXILANT) 60 MG capsule  Take 1 capsule (60 mg total) by mouth daily. 02/21/15  Yes Corbin Ade, MD  isosorbide mononitrate (IMDUR) 30 MG 24 hr tablet Take 1 tablet (30 mg total) by mouth daily. 05/30/15  Yes Mihai Croitoru, MD  lisinopril (PRINIVIL,ZESTRIL) 2.5 MG tablet Take 2.5 mg by mouth every evening.   Yes Historical Provider, MD  naloxegol oxalate (MOVANTIK) 12.5 MG TABS tablet Take 12.5 mg by mouth daily.   Yes Historical Provider, MD  ondansetron (ZOFRAN) 4 MG tablet Take 4 mg by mouth every 8 (eight) hours as needed for nausea or vomiting.  12/24/14  Yes Historical Provider,  MD  oxycodone (ROXICODONE) 30 MG immediate release tablet Take 30 mg by mouth 4 (four) times daily as needed. FOR PAIN 04/08/14  Yes Historical Provider, MD  OXYCONTIN 80 MG T12A 12 hr tablet Take 80 mg by mouth every 12 (twelve) hours.  03/27/14  Yes Historical Provider, MD  venlafaxine XR (EFFEXOR-XR) 75 MG 24 hr capsule Take 75 mg by mouth every evening.   Yes Historical Provider, MD   Family History Family History  Problem Relation Age of Onset  . Heart disease Father   . Heart attack Father   . Heart attack Brother   . Heart Problems Brother   . Heart attack Brother   . Cancer Child   . Colon cancer Neg Hx    Social History Social History  Substance Use Topics  . Smoking status: Current Some Day Smoker    Packs/day: 1.00    Types: Cigarettes  . Smokeless tobacco: Never Used  . Alcohol use No   Allergies   Imitrex [sumatriptan]; Ciprofloxacin; and Demerol [meperidine]  Review of Systems Review of Systems  Constitutional: Negative for fever.  Genitourinary: Negative for dysuria, frequency, hematuria and urgency.  Musculoskeletal: Positive for back pain.  Neurological: Negative for numbness.  All other systems reviewed and are negative.  Physical Exam Updated Vital Signs BP 167/65 (BP Location: Right Arm)   Pulse (!) 51   Temp 97.5 F (36.4 C) (Oral)   Resp 20   Ht 5\' 1"  (1.549 m)   Wt 111 lb (50.3 kg)   SpO2 96%   BMI 20.97 kg/m   Physical Exam  Constitutional: She appears well-developed and well-nourished.  Moaning in pain on exam.  Appears uncomfortable.   HENT:  Head: Normocephalic.  Eyes: Conjunctivae are normal.  Cardiovascular: Normal rate.   Pulmonary/Chest: Effort normal. No respiratory distress.  Abdominal: She exhibits no distension.  Musculoskeletal: Normal range of motion. She exhibits no edema, tenderness or deformity.  Well healed lumbar scar. Back pain is not reproducible. Strength and sensation preserved in BLE.  Neurological: She is alert.    Skin: Skin is warm and dry.  Psychiatric: She has a normal mood and affect. Her behavior is normal.  Nursing note and vitals reviewed.  ED Treatments / Results  DIAGNOSTIC STUDIES: Oxygen Saturation is 96% on RA, normal by my interpretation.   COORDINATION OF CARE: 10:29 PM-Discussed next steps with pt. Pt verbalized understanding and is agreeable with the plan.   Labs (all labs ordered are listed, but only abnormal results are displayed) Labs Reviewed - No data to display  EKG  EKG Interpretation None      Radiology No results found.  Procedures Procedures (including critical care time)  Medications Ordered in ED Medications - No data to display   Initial Impression / Assessment and Plan / ED Course  I have reviewed the triage vital signs and  the nursing notes.  Pertinent labs & imaging results that were available during my care of the patient were reviewed by me and considered in my medical decision making (see chart for details).  Clinical Course    Final Clinical Impressions(s) / ED Diagnoses   Final diagnoses:  Low back strain, initial encounter    New Prescriptions New Prescriptions   No medications on file   I personally preformed the services scribed in my presence. The recorded information has been reviewed is accurate. Raeford RazorStephen Zyree Traynham, MD.     Raeford RazorStephen Jahari Billy, MD 09/17/15 [phone]

## 2015-09-06 NOTE — ED Notes (Signed)
Pt brought in for back pain after a fall. Pt has hx of prior back sx. No bruising or redness noted to back. Pt c/o mid lower back pain. CNS intact. Pt moving all extremities.

## 2015-09-07 ENCOUNTER — Encounter (HOSPITAL_COMMUNITY): Payer: Self-pay

## 2015-09-07 ENCOUNTER — Emergency Department (HOSPITAL_COMMUNITY): Payer: Medicare Other

## 2015-09-07 ENCOUNTER — Emergency Department (HOSPITAL_COMMUNITY)
Admission: EM | Admit: 2015-09-07 | Discharge: 2015-09-08 | Disposition: A | Discharge status: Home / Self Care | Payer: Medicare Other | Attending: Emergency Medicine | Admitting: Emergency Medicine

## 2015-09-07 DIAGNOSIS — M545 Low back pain: Secondary | ICD-10-CM | POA: Diagnosis present

## 2015-09-07 DIAGNOSIS — M5442 Lumbago with sciatica, left side: Secondary | ICD-10-CM | POA: Diagnosis not present

## 2015-09-07 DIAGNOSIS — Z79899 Other long term (current) drug therapy: Secondary | ICD-10-CM | POA: Diagnosis not present

## 2015-09-07 DIAGNOSIS — I10 Essential (primary) hypertension: Secondary | ICD-10-CM | POA: Diagnosis not present

## 2015-09-07 DIAGNOSIS — F17210 Nicotine dependence, cigarettes, uncomplicated: Secondary | ICD-10-CM | POA: Insufficient documentation

## 2015-09-07 LAB — URINALYSIS, ROUTINE W REFLEX MICROSCOPIC
Bilirubin Urine: NEGATIVE
Glucose, UA: NEGATIVE mg/dL
KETONES UR: NEGATIVE mg/dL
NITRITE: NEGATIVE
PROTEIN: NEGATIVE mg/dL
Specific Gravity, Urine: 1.01 (ref 1.005–1.030)
pH: 5.5 (ref 5.0–8.0)

## 2015-09-07 LAB — URINE MICROSCOPIC-ADD ON

## 2015-09-07 MED ORDER — KETOROLAC TROMETHAMINE 30 MG/ML IJ SOLN
30.0000 mg | Freq: Once | INTRAMUSCULAR | Status: AC
  Administered 2015-09-07: 30 mg via INTRAVENOUS
  Filled 2015-09-07: qty 1

## 2015-09-07 MED ORDER — HYDROMORPHONE HCL 1 MG/ML IJ SOLN
1.0000 mg | Freq: Once | INTRAMUSCULAR | Status: AC
  Administered 2015-09-07: 1 mg via INTRAVENOUS
  Filled 2015-09-07: qty 1

## 2015-09-07 MED ORDER — LORAZEPAM 2 MG/ML IJ SOLN
0.5000 mg | Freq: Once | INTRAMUSCULAR | Status: AC
  Administered 2015-09-07: 0.5 mg via INTRAVENOUS
  Filled 2015-09-07: qty 1

## 2015-09-07 NOTE — ED Triage Notes (Signed)
Pt seen here last night for same, lives at home by herself but is not being cared for, and pt's daughter called ems to check on pt.  Pt states she "hurts all over" is moaning and writhing with pain.  Per ems, daughter reports pt has reported that her pain medications have been stolen.

## 2015-09-08 NOTE — Discharge Instructions (Signed)
Follow-up with her doctor for recheck.   °

## 2015-09-08 NOTE — ED Provider Notes (Signed)
[facility] DEPT Provider Note   CSN: [id number] Arrival date & time: 09/07/15  2037     History   Chief Complaint Chief Complaint  Patient presents with  . Back Pain    HPI Christine Rogers is a 75 y.o. female.   Back Pain   Pertinent negatives include no fever, no numbness, no abdominal pain, no dysuria and no weakness.     Christine Rogers is a 75 y.o. female with hx of DDD, depression, HTN, bipolar and GERD who presents to the Emergency Department complaining of persistent low back pain. She was seen here last evening for same.  Family of the patient states that she slipped and fell inside the home yesterday, landing on her buttocks.  Patient reports worsening back pain since the fall with pain radiating into her left leg to the level of her knee.  Patient's son states that be believes someone has stolen her medications although denies contacting police. Her pain is worsened by movement.  She denies fever, abdominal pain, numbness or weakness of the LE's, bowel or bladder incontinence.    Past Medical History:  Diagnosis Date  . Bipolar 1 disorder (HCC)   . DDD (degenerative disc disease), lumbar   . Depression   . GERD (gastroesophageal reflux disease)   . High cholesterol   . Hypertension   . Sleep apnea     Patient Active Problem List   Diagnosis Date Noted  . Chest pain on exertion 04/06/2015  . RUQ pain 03/08/2015  . Mucosal abnormality of stomach   . GERD (gastroesophageal reflux disease) 12/27/2014  . N&V (nausea and vomiting) 12/27/2014  . Weight gain 12/27/2014  . Constipation 12/27/2014  . Dyslipidemia 08/22/2009  . TOBACCO ABUSE 08/22/2009  . ESSENTIAL HYPERTENSION, BENIGN 08/22/2009  . CORONARY ATHEROSCLEROSIS NATIVE CORONARY ARTERY 08/22/2009  . CLAUDICATION 08/22/2009    Past Surgical History:  Procedure Laterality Date  . ABDOMINAL HYSTERECTOMY    . BACK SURGERY    . BIOPSY  01/23/2015   Procedure: BIOPSY;  Surgeon: Corbin Adeobert M Rourk, MD;   Location: [facility];  Service: Endoscopy;;  gastric biopsies  . ESOPHAGOGASTRODUODENOSCOPY (EGD) WITH PROPOFOL N/A 01/23/2015   RMR: abnormal gastric mucosa.pyloric channel of uncertain significance status post biopys  . TONSILLECTOMY      OB History    Gravida Para Term Preterm AB Living   5 5 4 1   4    SAB TAB Ectopic Multiple Live Births                   Home Medications    Prior to Admission medications   Medication Sig Start Date End Date Taking? Authorizing Provider  amLODipine (NORVASC) 2.5 MG tablet Take 2.5 mg by mouth every evening.    Historical Provider, MD  atorvastatin (LIPITOR) 20 MG tablet Take 1 tablet (20 mg total) by mouth daily. 05/30/15   Mihai Croitoru, MD  benzonatate (TESSALON) 100 MG capsule Take 100 mg by mouth 3 (three) times daily as needed for cough.    Historical Provider, MD  dexlansoprazole (DEXILANT) 60 MG capsule Take 1 capsule (60 mg total) by mouth daily. 02/21/15   Corbin Adeobert M Rourk, MD  isosorbide mononitrate (IMDUR) 30 MG 24 hr tablet Take 1 tablet (30 mg total) by mouth daily. 05/30/15   Mihai Croitoru, MD  lisinopril (PRINIVIL,ZESTRIL) 2.5 MG tablet Take 2.5 mg by mouth every evening.    Historical Provider, MD  naloxegol oxalate (MOVANTIK) 12.5 MG TABS tablet Take  12.5 mg by mouth daily.    Historical Provider, MD  ondansetron (ZOFRAN) 4 MG tablet Take 4 mg by mouth every 8 (eight) hours as needed for nausea or vomiting.  12/24/14   Historical Provider, MD  oxycodone (ROXICODONE) 30 MG immediate release tablet Take 30 mg by mouth 4 (four) times daily as needed. FOR PAIN 04/08/14   Historical Provider, MD  OXYCONTIN 80 MG T12A 12 hr tablet Take 80 mg by mouth every 12 (twelve) hours.  03/27/14   Historical Provider, MD  predniSONE (DELTASONE) 20 MG tablet Take 2 tablets (40 mg total) by mouth daily. 09/06/15   Raeford Razor, MD  venlafaxine XR (EFFEXOR-XR) 75 MG 24 hr capsule Take 75 mg by mouth every evening.    Historical Provider, MD    Family  History Family History  Problem Relation Age of Onset  . Heart disease Father   . Heart attack Father   . Heart attack Brother   . Heart Problems Brother   . Heart attack Brother   . Cancer Child   . Colon cancer Neg Hx     Social History Social History  Substance Use Topics  . Smoking status: Current Some Day Smoker    Packs/day: 1.00    Types: Cigarettes  . Smokeless tobacco: Never Used  . Alcohol use No     Allergies   Imitrex [sumatriptan]; Ciprofloxacin; and Demerol [meperidine]   Review of Systems Review of Systems  Constitutional: Negative for fever.  Respiratory: Negative for shortness of breath.   Gastrointestinal: Negative for abdominal pain, constipation and vomiting.  Genitourinary: Negative for decreased urine volume, difficulty urinating, dysuria, flank pain and hematuria.  Musculoskeletal: Positive for back pain. Negative for joint swelling.  Skin: Negative for rash.  Neurological: Negative for dizziness, weakness and numbness.  All other systems reviewed and are negative.    Physical Exam Updated Vital Signs BP 175/87   Pulse (!) 54   Resp 15   Ht 5\' 5"  (1.651 m)   Wt 54.4 kg   SpO2 100%   BMI 19.97 kg/m   Physical Exam  Constitutional: She is oriented to person, place, and time. She appears well-developed and well-nourished. No distress.  HENT:  Head: Normocephalic and atraumatic.  Neck: Normal range of motion. Neck supple.  Cardiovascular: Normal rate, regular rhythm, normal heart sounds and intact distal pulses.   No murmur heard. Pulmonary/Chest: Effort normal and breath sounds normal. No respiratory distress.  Abdominal: Soft. She exhibits no distension. There is no tenderness.  Musculoskeletal: She exhibits tenderness. She exhibits no edema.       Lumbar back: She exhibits tenderness and pain. She exhibits normal range of motion, no swelling, no deformity, no laceration and normal pulse.  Midline tenderness of the lower lumbar spine  and bilateral paraspinal muscles.  DP pulses are brisk and symmetrical.  Distal sensation intact.  Pt has 5/5 strength against resistance of bilateral lower extremities.     Neurological: She is alert and oriented to person, place, and time. She has normal strength. No sensory deficit. She exhibits normal muscle tone. Coordination and gait normal.  Skin: Skin is warm and dry. No rash noted.  Nursing note and vitals reviewed.    ED Treatments / Results  Labs (all labs ordered are listed, but only abnormal results are displayed) Labs Reviewed  URINALYSIS, ROUTINE W REFLEX MICROSCOPIC (NOT AT [facility]) - Abnormal; Notable for the following:       Result Value   Hgb urine  dipstick TRACE (*)    Leukocytes, UA TRACE (*)    All other components within normal limits  URINE MICROSCOPIC-ADD ON - Abnormal; Notable for the following:    Squamous Epithelial / LPF 0-5 (*)    Bacteria, UA RARE (*)    All other components within normal limits    EKG  EKG Interpretation None       Radiology Dg Lumbar Spine Complete  Result Date: 09/07/2015 CLINICAL DATA:  Low back pain.  History of lumbar spine fusion. EXAM: LUMBAR SPINE - COMPLETE 4+ VIEW COMPARISON:  None. FINDINGS: Lumbar vertebral bodies intact. Straightened lumbar lordosis. Grade 1 L5-S1 retrolisthesis and severe disc height loss. Status post L4-5 PLIF, intact well-seated hardware with arthrodesis. Moderate to severe L3-4 disc height loss, endplate sclerosis and marginal spurring. Osteopenia. No destructive bony lesions. Phleboliths in the pelvis. Moderate vascular calcifications. IMPRESSION: No acute fracture deformity. L4-5 PLIF, in solid fusion. Grade 1 L5-S1 retrolisthesis and findings of adjacent segment disease. Osteopenia, decreasing sensitivity for acute nondisplaced fractures. Send Electronically Signed   By: Awilda Metro M.D.   On: 09/07/2015 22:57    Procedures Procedures (including critical care time)  Medications Ordered in  ED Medications  HYDROmorphone (DILAUDID) injection 1 mg (1 mg Intravenous Given 09/07/15 2217)  LORazepam (ATIVAN) injection 0.5 mg (0.5 mg Intravenous Given 09/07/15 2216)  ketorolac (TORADOL) 30 MG/ML injection 30 mg (30 mg Intravenous Given 09/07/15 2216)     Initial Impression / Assessment and Plan / ED Course  I have reviewed the triage vital signs and the nursing notes.  Pertinent labs & imaging results that were available during my care of the patient were reviewed by me and considered in my medical decision making (see chart for details).  Clinical Course   Pt is feeling better after medications.  Likely acute on chronic low back pain.  XR neg for acute fx's.  Pt moving all around on the stretcher on initial exam.  No concerning sx's for emergent neurological process.  Appears stable for d/c and agrees to PMD f/u.    Pt reviewed on [place] CSRS and received hydromet syrup on 09/01/15, oxycontin 80 mg #60 on 08/12/15, and oxycodone 30 mg #120 on 08/12/15. Understands that no further narcotics will be prescribed.    Final Clinical Impressions(s) / ED Diagnoses   Final diagnoses:  Midline low back pain with left-sided sciatica    New Prescriptions New Prescriptions   No medications on file     Rosey Bath 09/08/15 0101    Raeford Razor, MD 09/13/15 1232

## 2015-10-04 ENCOUNTER — Ambulatory Visit: Payer: Medicare Other | Admitting: Cardiovascular Disease

## 2015-10-19 ENCOUNTER — Encounter: Payer: Self-pay | Admitting: Cardiovascular Disease

## 2015-10-19 ENCOUNTER — Ambulatory Visit (INDEPENDENT_AMBULATORY_CARE_PROVIDER_SITE_OTHER): Payer: Medicare Other | Admitting: Cardiovascular Disease

## 2015-10-19 VITALS — BP 168/70 | HR 79 | Ht 60.0 in | Wt 114.0 lb

## 2015-10-19 DIAGNOSIS — I2699 Other pulmonary embolism without acute cor pulmonale: Secondary | ICD-10-CM

## 2015-10-19 DIAGNOSIS — I25118 Atherosclerotic heart disease of native coronary artery with other forms of angina pectoris: Secondary | ICD-10-CM

## 2015-10-19 DIAGNOSIS — I10 Essential (primary) hypertension: Secondary | ICD-10-CM

## 2015-10-19 DIAGNOSIS — F17200 Nicotine dependence, unspecified, uncomplicated: Secondary | ICD-10-CM | POA: Diagnosis not present

## 2015-10-19 DIAGNOSIS — E785 Hyperlipidemia, unspecified: Secondary | ICD-10-CM

## 2015-10-19 HISTORY — DX: Other pulmonary embolism without acute cor pulmonale: I26.99

## 2015-10-19 NOTE — Patient Instructions (Addendum)
Your physician discussed the hazards of tobacco use. Tobacco use cessation is recommended and techniques and options to help you quit were discussed.  Dr Royann Shiversroitoru recommends that you schedule a follow-up appointment in 3 months in our [place] office.  If you need a refill on your cardiac medications before your next appointment, please call your pharmacy.   Take Xarelto with meals!

## 2015-10-19 NOTE — Progress Notes (Signed)
Patient ID: Christine Rogers, female   DOB: Oct 13, 1940, 75 y.o.   MRN: [id number] Patient ID: Christine Rogers, female   DOB: October 22, 1940, 75 y.o.   MRN: [id number]    Cardiology Office Note    Date:  10/19/2015   ID:  Christine, Rogers [birth year], MRN [id number]  PCP:  [name], DO  Cardiologist:   Thurmon Fair, MD   Chief Complaint  Patient presents with  . Follow-up    3 MONTHS    History of Present Illness:  Christine Rogers is a 75 y.o. female with a history of nonobstructive coronary artery disease by previous cardiac catheterization, long history of smoking presents in follow-up roughly 3 weeks after discharge from [facility]. She was admitted there on September 17 with pleuritic right-sided chest pain and was diagnosed with pulmonary embolus. She is taking Xarelto. She is getting ready to switch to the maintenance dose of 20 mg once daily. She has not had any bleeding complications. She continues to have some pleuritic right-sided chest pain, although this is gradually improving. She does not recall having lower extremity ultrasound for DVT, but I do not have any actual records other than her discharge instructions from [facility].   Her previous atypical retrosternal chest discomfort has resolved. She is taking low dose nitrates. She stopped taking aspirin when she started Xarelto.  Physical activity is very limited due to chronic back pain. She underwent back fusion in 2001. She is on chronic narcotic analgesics. Her right hip and right leg are very weak and she is unable to walk for more than 5 or 10 minutes at a time. Her cholesterol medications were stopped due to this weakness, but she has not noticed any improvement. She has a history of 50 years of one pack per day smoking and has been unable to quit smoking even using nicotine replacement therapy. She is now smoking roughly half a pack a day. She has trouble sleeping at night and smokes then as well. She does not have  diabetes mellitus.  Her family history is strongly positive for premature coronary disease (father age 64, brother age 30 as well as 2 other brothers with coronary problems in her 38s).  Coronary angiography performed in 2002 showed a 50-70% mid LAD stenosis and minor disease in the first diagonal branch without other significant obstructive lesions and with preserved left ventricular systolic function. Medical therapy was recommended.  She underwent a nuclear stress test on May 09, 2015 which showed low risk findings with preserved EF and no evidence of reversible ischemia.  Past Medical History:  Diagnosis Date  . Bipolar 1 disorder (HCC)   . DDD (degenerative disc disease), lumbar   . Depression   . GERD (gastroesophageal reflux disease)   . High cholesterol   . Hypertension   . Sleep apnea     Past Surgical History:  Procedure Laterality Date  . ABDOMINAL HYSTERECTOMY    . BACK SURGERY    . BIOPSY  01/23/2015   Procedure: BIOPSY;  Surgeon: Corbin Ade, MD;  Location[facility];  Service: Endoscopy;;  gastric biopsies  . ESOPHAGOGASTRODUODENOSCOPY (EGD) WITH PROPOFOL N/A 01/23/2015   RMR: abnormal gastric mucosa.pyloric channel of uncertain significance status post biopys  . TONSILLECTOMY      Current Medications: Outpatient Medications Prior to Visit  Medication Sig Dispense Refill  . amLODipine (NORVASC) 2.5 MG tablet Take 2.5 mg by mouth every evening.    Marland Kitchen atorvastatin (LIPITOR) 20 MG tablet  Take 1 tablet (20 mg total) by mouth daily. 30 tablet 11  . benzonatate (TESSALON) 100 MG capsule Take 100 mg by mouth 3 (three) times daily as needed for cough.    . dexlansoprazole (DEXILANT) 60 MG capsule Take 1 capsule (60 mg total) by mouth daily. 30 capsule 11  . isosorbide mononitrate (IMDUR) 30 MG 24 hr tablet Take 1 tablet (30 mg total) by mouth daily. 30 tablet 11  . lisinopril (PRINIVIL,ZESTRIL) 2.5 MG tablet Take 2.5 mg by mouth every evening.    . naloxegol oxalate  (MOVANTIK) 12.5 MG TABS tablet Take 12.5 mg by mouth daily.    . ondansetron (ZOFRAN) 4 MG tablet Take 4 mg by mouth every 8 (eight) hours as needed for nausea or vomiting.     Marland Kitchen. oxycodone (ROXICODONE) 30 MG immediate release tablet Take 30 mg by mouth 4 (four) times daily as needed. FOR PAIN    . OXYCONTIN 80 MG T12A 12 hr tablet Take 80 mg by mouth every 12 (twelve) hours.     . predniSONE (DELTASONE) 20 MG tablet Take 2 tablets (40 mg total) by mouth daily. 6 tablet 0  . venlafaxine XR (EFFEXOR-XR) 75 MG 24 hr capsule Take 75 mg by mouth every evening.     No facility-administered medications prior to visit.      Allergies:   Imitrex [sumatriptan]; Ciprofloxacin; and Demerol [meperidine]   Social History   Social History  . Marital status: Divorced    Spouse name: N/A  . Number of children: N/A  . Years of education: N/A   Social History Main Topics  . Smoking status: Current Some Day Smoker    Packs/day: 1.00    Types: Cigarettes  . Smokeless tobacco: Never Used  . Alcohol use No  . Drug use: No  . Sexual activity: Not Asked   Other Topics Concern  . None   Social History Narrative  . None     Family History:  The patient's family history includes Cancer in her child; Heart Problems in her brother; Heart attack in her brother, brother, and father; Heart disease in her father.   ROS:   Please see the history of present illness.    ROS All other systems reviewed and are negative.   PHYSICAL EXAM:   VS:  BP (!) 168/70   Pulse 79   Ht 5' (1.524 m)   Wt 114 lb (51.7 kg)   BMI 22.26 kg/m    Recheck BP 130/80 GEN: Well nourished, well developed, in no acute distress  HEENT: normal  Neck: no JVD, carotid bruits, or masses Cardiac: RRR; no murmurs, rubs, or gallops,no edema  Respiratory:  clear to auscultation bilaterally, normal work of breathing GI: soft, nontender, nondistended, + BS MS: no deformity or atrophy  Skin: warm and dry, no rash Neuro:  Alert and  Oriented x 3, Strength and sensation are intact Psych: euthymic mood, full affect  Wt Readings from Last 3 Encounters:  10/19/15 114 lb (51.7 kg)  09/07/15 120 lb (54.4 kg)  09/06/15 111 lb (50.3 kg)      Studies/Labs Reviewed:   EKG:  EKG is ordered today.  It demonstrates normal sinus rhythm with PACs,mild nonspecific T-wave changes, QTc 447 ms  Recent Labs: 03/26/2015: ALT 10; BUN 6; Creatinine, Ser 0.87; Hemoglobin 13.3; Platelets 311; Potassium 3.3; Sodium 137  06/19/2015 glucose 81 BUN 6 creatinine 0.65 albumin 3.25 alkaline phosphatase 129, otherwise normal LFTs Lipid Panel 06/19/2015 total cholesterol 126, HDL 50,  LDL 70, triglycerides 32  ASSESSMENT:    1. Other acute pulmonary embolism without acute cor pulmonale (HCC)   2. Atherosclerosis of native coronary artery of native heart with other form of angina pectoris (HCC)   3. TOBACCO ABUSE   4. Dyslipidemia   5. Essential hypertension, benign      PLAN:  In order of problems listed above:  1. Pulmonary embolism: Do not have records from [facility]. Unclear if they did do lower extremity venous duplex ultrasound or if a cause for her pulmonary embolism was identified. The current available data would recommend continuing the anticoagulation for a full 12 months. Not sure if hypercoagulable workup was performed. She has not had any bleeding problems 2. CAD / Exertional angina pectoris: Recent low risk nuclear stress test. Known moderate coronary artery disease by previous angiography. Essentially angina free after we added long-acting nitrates to her amlodipine. Avoiding beta blockers due to smoking and reactive airway disease. She had moderate stenosis in the LAD artery by previous cardiac catheterization. There was no evidence of anterior ischemia on the recent nuclear stress test 3. Smoking:  Again strongly recommended smoking cessation. . 4. HLP: . Excellent lipid profile when taking statin 5. HTN: Well  controlled, some elevated readings when in pain.  Mrs. Platter has significant transportation issues. She has missed or rescheduled many appointments since it is hard to travel to [place]. I have advised her to follow-up with one of my partners at the office in [place] or [facility]    Medication Adjustments/Labs and Tests Ordered: Current medicines are reviewed at length with the patient today.  Concerns regarding medicines are outlined above.  Medication changes, Labs and Tests ordered today are listed in the Patient Instructions below. Patient Instructions  Your physician discussed the hazards of tobacco use. Tobacco use cessation is recommended and techniques and options to help you quit were discussed.  Dr Royann Shivers recommends that you schedule a follow-up appointment in 3 months in our [place] office.  If you need a refill on your cardiac medications before your next appointment, please call your pharmacy.   Take Xarelto with meals!    Signed, Thurmon Fair, MD  10/19/2015 5:26 PM    [facility] [address] [place], [place], [place]  [address] Phone: [phone]; Fax: [phone]

## 2015-12-19 ENCOUNTER — Other Ambulatory Visit: Payer: Self-pay | Admitting: Internal Medicine

## 2016-01-30 ENCOUNTER — Encounter: Payer: Self-pay | Admitting: Cardiology

## 2016-01-30 ENCOUNTER — Ambulatory Visit (INDEPENDENT_AMBULATORY_CARE_PROVIDER_SITE_OTHER): Payer: Medicare Other | Admitting: Cardiology

## 2016-01-30 VITALS — BP 110/58 | HR 94 | Ht 61.0 in | Wt 120.0 lb

## 2016-01-30 DIAGNOSIS — I2510 Atherosclerotic heart disease of native coronary artery without angina pectoris: Secondary | ICD-10-CM | POA: Diagnosis not present

## 2016-01-30 DIAGNOSIS — E782 Mixed hyperlipidemia: Secondary | ICD-10-CM

## 2016-01-30 DIAGNOSIS — I10 Essential (primary) hypertension: Secondary | ICD-10-CM

## 2016-01-30 DIAGNOSIS — I2699 Other pulmonary embolism without acute cor pulmonale: Secondary | ICD-10-CM

## 2016-01-30 MED ORDER — ATORVASTATIN CALCIUM 20 MG PO TABS: 20.0000 mg | ORAL_TABLET | Freq: Every day | ORAL | 3 refills | Status: DC

## 2016-01-30 NOTE — Patient Instructions (Signed)
Medication Instructions:  START ATORVASTATIN 20 MG DAILY   Labwork: NONE  Testing/Procedures: NONE  Follow-Up: Your physician wants you to follow-up in: 6 MONTHS.  You will receive a reminder letter in the mail two months in advance. If you don't receive a letter, please call our office to schedule the follow-up appointment.   Any Other Special Instructions Will Be Listed Below (If Applicable).  YOU HAVE BEEN REFERRED TO HEMATOLOGY, SOMEONE FROM THEIR OFFICE WILL CONTACT YOU SOON.    If you need a refill on your cardiac medications before your next appointment, please call your pharmacy.

## 2016-01-30 NOTE — Progress Notes (Signed)
Clinical Summary Ms. Christine Rogers is a 76 y.o.female previous patient of Dr Christine Rogers, this is our first visit together.  1. CAD - mild to moderate nonobstructive CAD by previous cath in 2002, which showed a 50-70% LAD lesion - history of chronic chest pain - strong family history of early CAD (father age 74, brother age 74 as well as 2 other brothers with coronary problems in her 9070s). - nuclear stress test 05/2015 without ischemia, low risk - prevous abdominal US did mild show biliiary ductal dilatation, mildly dilated CBD. Followed by GI, subsequent imaging has not been impressive.   -no recent chest pain, has done well on long acting nitrates.   2. PE - [facility] with PE, on xarelto - no prior blood clot in the past, no family history. 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   3. Chronic back pain - followed by pcp  4,. HTN - compliant with meds  5. Hyperlipidemia - 06/2015 TC 126 TG 32 HDL 50 LDL 70 - she reports pcp stopped lipitor. She denies any significant side effects.   Past Medical History:  Diagnosis Date  . Bipolar 1 disorder (HCC)   . DDD (degenerative disc disease), lumbar   . Depression   . GERD (gastroesophageal reflux disease)   . High cholesterol   . Hypertension   . Sleep apnea      Allergies  Allergen Reactions  . Imitrex [Sumatriptan] Anaphylaxis and Swelling  . Ciprofloxacin     UNKNOWN REACTION  . Demerol [Meperidine] Nausea And Vomiting     Current Outpatient Prescriptions  Medication Sig Dispense Refill  . amLODipine (NORVASC) 2.5 MG tablet Take 2.5 mg by mouth every evening.    Marland Kitchen. atorvastatin (LIPITOR) 20 MG tablet Take 1 tablet (20 mg total) by mouth daily. 30 tablet 11  . benzonatate (TESSALON) 100 MG capsule Take 100 mg by mouth 3 (three) times daily as needed for cough.    . DEXILANT 60 MG capsule TAKE ONE (1) CAPSULE EACH DAY 30 capsule 5  . fentaNYL (DURAGESIC - DOSED MCG/HR) 100 MCG/HR Place 100 mcg onto the skin every 3  (three) days.    . isosorbide mononitrate (IMDUR) 30 MG 24 hr tablet Take 1 tablet (30 mg total) by mouth daily. 30 tablet 11  . lisinopril (PRINIVIL,ZESTRIL) 2.5 MG tablet Take 2.5 mg by mouth every evening.    . naloxegol oxalate (MOVANTIK) 12.5 MG TABS tablet Take 12.5 mg by mouth daily.    . ondansetron (ZOFRAN) 4 MG tablet Take 4 mg by mouth every 8 (eight) hours as needed for nausea or vomiting.     Marland Kitchen. oxycodone (ROXICODONE) 30 MG immediate release tablet Take 30 mg by mouth 4 (four) times daily as needed. FOR PAIN    . OXYCONTIN 80 MG T12A 12 hr tablet Take 80 mg by mouth every 12 (twelve) hours.     . predniSONE (DELTASONE) 20 MG tablet Take 2 tablets (40 mg total) by mouth daily. 6 tablet 0  . Rivaroxaban (XARELTO) 15 MG TABS tablet Take 15 mg by mouth 2 (two) times daily with a meal.    . rivaroxaban (XARELTO) 20 MG TABS tablet Take 20 mg by mouth daily.    Marland Kitchen. venlafaxine XR (EFFEXOR-XR) 75 MG 24 hr capsule Take 75 mg by mouth every evening.     No current facility-administered medications for this visit.      Past Surgical History:  Procedure Laterality Date  . ABDOMINAL HYSTERECTOMY    .  BACK SURGERY    . BIOPSY  01/23/2015   Procedure: BIOPSY;  Surgeon: Corbin Ade, MD;  Location: [facility];  Service: Endoscopy;;  gastric biopsies  . ESOPHAGOGASTRODUODENOSCOPY (EGD) WITH PROPOFOL N/A 01/23/2015   RMR: abnormal gastric mucosa.pyloric channel of uncertain significance status post biopys  . TONSILLECTOMY       Allergies  Allergen Reactions  . Imitrex [Sumatriptan] Anaphylaxis and Swelling  . Ciprofloxacin     UNKNOWN REACTION  . Demerol [Meperidine] Nausea And Vomiting      Family History  Problem Relation Age of Onset  . Heart disease Father   . Heart attack Father   . Heart attack Brother   . Heart Problems Brother   . Heart attack Brother   . Cancer Child   . Colon cancer Neg Hx      Social History Ms. Christine Rogers reports that she has been smoking  Cigarettes.  She has been smoking about 1.00 pack per day. She has never used smokeless tobacco. Ms. Christine Rogers reports that she does not drink alcohol.   Review of Systems CONSTITUTIONAL: No weight loss, fever, chills, weakness or fatigue.  HEENT: Eyes: No visual loss, blurred vision, double vision or yellow sclerae.No hearing loss, sneezing, congestion, runny nose or sore throat.  SKIN: No rash or itching.  CARDIOVASCULAR: per HPI RESPIRATORY: No shortness of breath, cough or sputum.  GASTROINTESTINAL: No anorexia, nausea, vomiting or diarrhea. No abdominal pain or blood.  GENITOURINARY: No burning on urination, no polyuria NEUROLOGICAL: No headache, dizziness, syncope, paralysis, ataxia, numbness or tingling in the extremities. No change in bowel or bladder control.  MUSCULOSKELETAL: No muscle, back pain, joint pain or stiffness.  LYMPHATICS: No enlarged nodes. No history of splenectomy.  PSYCHIATRIC: No history of depression or anxiety.  ENDOCRINOLOGIC: No reports of sweating, cold or heat intolerance. No polyuria or polydipsia.  Marland Kitchen   Physical Examination Vitals:   01/30/16 1453  BP: (!) 110/58  Pulse: 94   Vitals:   01/30/16 1453  Weight: 120 lb (54.4 kg)  Height: 5\' 1"  (1.549 m)    Gen: resting comfortably, no acute distress HEENT: no scleral icterus, pupils equal round and reactive, no palptable cervical adenopathy,  CV: RRR, no m/rg, no jvd Resp: Clear to auscultation bilaterally GI: abdomen is soft, non-tender, non-distended, normal bowel sounds, no hepatosplenomegaly MSK: extremities are warm, no edema.  Skin: warm, no rash Neuro:  no focal deficits Psych: appropriate affect   Diagnostic Studies 05/2015 Nuclear stress  Nuclear stress EF: 61%.  The left ventricular ejection fraction is normal (55-65%).  There was no ST segment deviation noted during stress.  The study is normal.  This is a low risk study.    Assessment and Plan  1. CAD - no current  symptoms - continue current meds  2. 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 We will refer to hematology to help decide if should be on anticoag indefinitely  3. HTN - at goal, continue current meds  4. Hyperlipidemia - given her CAD would recommend continuing statin, restart atorva 20mg  daily  F/u 6 monhts      Antoine Poche, M.D.

## 2016-02-08 ENCOUNTER — Encounter (HOSPITAL_COMMUNITY): Payer: Medicare Other | Admitting: Oncology

## 2016-03-11 ENCOUNTER — Encounter (HOSPITAL_COMMUNITY): Payer: Medicare Other | Attending: Oncology | Admitting: Oncology

## 2016-03-11 ENCOUNTER — Encounter (HOSPITAL_COMMUNITY): Payer: Medicare Other

## 2016-03-11 ENCOUNTER — Encounter (HOSPITAL_COMMUNITY): Payer: Self-pay | Admitting: Oncology

## 2016-03-11 DIAGNOSIS — Z8249 Family history of ischemic heart disease and other diseases of the circulatory system: Secondary | ICD-10-CM | POA: Diagnosis not present

## 2016-03-11 DIAGNOSIS — G4730 Sleep apnea, unspecified: Secondary | ICD-10-CM | POA: Insufficient documentation

## 2016-03-11 DIAGNOSIS — Z86711 Personal history of pulmonary embolism: Secondary | ICD-10-CM | POA: Diagnosis not present

## 2016-03-11 DIAGNOSIS — I2699 Other pulmonary embolism without acute cor pulmonale: Secondary | ICD-10-CM | POA: Insufficient documentation

## 2016-03-11 DIAGNOSIS — N390 Urinary tract infection, site not specified: Secondary | ICD-10-CM | POA: Diagnosis not present

## 2016-03-11 DIAGNOSIS — I10 Essential (primary) hypertension: Secondary | ICD-10-CM | POA: Insufficient documentation

## 2016-03-11 DIAGNOSIS — Z90710 Acquired absence of both cervix and uterus: Secondary | ICD-10-CM | POA: Diagnosis not present

## 2016-03-11 DIAGNOSIS — F319 Bipolar disorder, unspecified: Secondary | ICD-10-CM | POA: Insufficient documentation

## 2016-03-11 DIAGNOSIS — K219 Gastro-esophageal reflux disease without esophagitis: Secondary | ICD-10-CM | POA: Diagnosis not present

## 2016-03-11 DIAGNOSIS — M5136 Other intervertebral disc degeneration, lumbar region: Secondary | ICD-10-CM | POA: Diagnosis not present

## 2016-03-11 DIAGNOSIS — R4182 Altered mental status, unspecified: Secondary | ICD-10-CM | POA: Diagnosis not present

## 2016-03-11 DIAGNOSIS — Z9889 Other specified postprocedural states: Secondary | ICD-10-CM | POA: Insufficient documentation

## 2016-03-11 DIAGNOSIS — Z820 Family history of epilepsy and other diseases of the nervous system: Secondary | ICD-10-CM | POA: Insufficient documentation

## 2016-03-11 DIAGNOSIS — E7800 Pure hypercholesterolemia, unspecified: Secondary | ICD-10-CM | POA: Insufficient documentation

## 2016-03-11 DIAGNOSIS — F17210 Nicotine dependence, cigarettes, uncomplicated: Secondary | ICD-10-CM | POA: Insufficient documentation

## 2016-03-11 DIAGNOSIS — J449 Chronic obstructive pulmonary disease, unspecified: Secondary | ICD-10-CM | POA: Insufficient documentation

## 2016-03-11 DIAGNOSIS — Z809 Family history of malignant neoplasm, unspecified: Secondary | ICD-10-CM | POA: Insufficient documentation

## 2016-03-11 LAB — CBC WITH DIFFERENTIAL/PLATELET
BASOS ABS: 0.1 10*3/uL (ref 0.0–0.1)
Basophils Relative: 1 %
EOS PCT: 1 %
Eosinophils Absolute: 0.1 10*3/uL (ref 0.0–0.7)
HCT: 38.3 % (ref 36.0–46.0)
Hemoglobin: 12.7 g/dL (ref 12.0–15.0)
LYMPHS ABS: 1.6 10*3/uL (ref 0.7–4.0)
LYMPHS PCT: 16 %
MCH: 30.2 pg (ref 26.0–34.0)
MCHC: 33.2 g/dL (ref 30.0–36.0)
MCV: 91 fL (ref 78.0–100.0)
MONO ABS: 0.7 10*3/uL (ref 0.1–1.0)
Monocytes Relative: 7 %
Neutro Abs: 7.7 10*3/uL (ref 1.7–7.7)
Neutrophils Relative %: 75 %
PLATELETS: 279 10*3/uL (ref 150–400)
RBC: 4.21 MIL/uL (ref 3.87–5.11)
RDW: 14.6 % (ref 11.5–15.5)
WBC: 10.1 10*3/uL (ref 4.0–10.5)

## 2016-03-11 LAB — COMPREHENSIVE METABOLIC PANEL
ALT: 6 U/L — AB (ref 14–54)
AST: 17 U/L (ref 15–41)
Albumin: 3.4 g/dL — ABNORMAL LOW (ref 3.5–5.0)
Alkaline Phosphatase: 126 U/L (ref 38–126)
Anion gap: 10 (ref 5–15)
BUN: 9 mg/dL (ref 6–20)
CHLORIDE: 101 mmol/L (ref 101–111)
CO2: 24 mmol/L (ref 22–32)
CREATININE: 0.96 mg/dL (ref 0.44–1.00)
Calcium: 8.6 mg/dL — ABNORMAL LOW (ref 8.9–10.3)
GFR calc Af Amer: >60 mL/min (ref >60)
GFR, EST NON AFRICAN AMERICAN: 57 mL/min — AB (ref >60)
Glucose, Bld: 85 mg/dL (ref 65–99)
POTASSIUM: 3.1 mmol/L — AB (ref 3.5–5.1)
Sodium: 135 mmol/L (ref 135–145)
TOTAL PROTEIN: 6.7 g/dL (ref 6.5–8.1)
Total Bilirubin: 0.2 mg/dL — ABNORMAL LOW (ref 0.3–1.2)

## 2016-03-11 LAB — IRON AND TIBC
IRON: 39 ug/dL (ref 28–170)
Saturation Ratios: 12 % (ref 10.4–31.8)
TIBC: 316 ug/dL (ref 250–450)
UIBC: 277 ug/dL

## 2016-03-11 LAB — FERRITIN: FERRITIN: 24 ng/mL (ref 11–307)

## 2016-03-11 NOTE — Assessment & Plan Note (Addendum)
Acute segmental pulmonary embolism to right middle and lower lobe without CT evidence for right heart strain on 09/25/2015, anticoagulated with Xarelto.  Acquired risk factors for predisposing conditions for thrombosis including prior thrombotic event, recent major surgery (specifically orthopedic), presence of central venous catheter, trauma, immobilization, malignancy, pregnancy, the use of oral contraceptives or heparin, myeloproliferative disorders, antiphospholipid syndrome (APS), and a number of major medical illnesses (heart failure, congenital heart disease, age greater than or equal to 65 years, obesity, PNH, inflammatory bowel disease, nephrotic syndrome).    Many patients with an episode of ETE have more than 1 acquired risk factor from for thrombosis.  This was shown in a population-based study of the incidence of ETE and residents of [place], [place] during 1999.  6 most prevalent pre-existing medical characteristics of patients in the study were:  More than 48 hours of immobility in the preceding month-45%  Hospital admission in the past 3 months-39%  Surgery in the past 3 months-34%  Malignancy in the past 3 months-34%  Infection the past 3 months-34%  Current hospitalization-26%. Only 11% of the 587 episodes of ETE had none of the 6 characteristics present, 36 and 53% had 1-2 and >/= 3 risk factors, respectively.  The following coag tests are reliable in the setting of anticoagulation while on Xarelto, Pradaxa, or Eliquis:  1. Factor V Leiden  2. Prothombin 20210A  3. Anticardiolipin antibody  4. Anti-beta-glycoprotein-I antibody  5. Protein C+S free and total  6. Antithrombin antigen.  The following coag tests are not reliable in the setting of Xarelto, Pradaxa, or Eliquis:  1. Protein C+S activity (falsely normal)  2. Antithrombin activity (falsely elevated)  3. APC resistance (flsely negative)  4. Lupus anticoagulant tests (unknwon their reliability in the setting  of Xarelto, Pradaxa, Eliquis)  Unprovoked proximal DVT and symptomatic PE-The decision regarding the use of indefinite anticoagulation in patients with a first episode of unprovoked proximal deep vein thrombosis (DVT), unprovoked symptomatic pulmonary embolism (PE), or patients with active cancer is dependent upon patient-specific bleeding and thrombotic risks as well as the patient's values and preferences. For those with a low to moderate bleeding risk, we suggest indefinite treatment rather than treatment for three to six months. For patients with a high bleeding risk, the benefits of indefinite anticoagulation are likely outweighed by the high risk of bleeding such that indefinite anticoagulation is not generally performed.  The rationale for indefinite anticoagulation in patients with an unprovoked proximal DVT or symptomatic PE is based upon the high estimated lifetime risk of recurrent VTE, which can be dramatically reduced by extending anticoagulation beyond the conventional three to six months. Importantly, most studies report a risk reduction in the rate of VTE recurrence at the expense of an increased rate of bleeding and without mortality benefit.  The estimated risk of recurrence following cessation of anticoagulation in patients with a first unprovoked episode of VTE is 10 percent at one year and 30 percent at five years (approximately 5 percent per year after the first year). Prior duration of therapy does not affect the risk of recurrence once anticoagulant therapy is discontinued. Full anticoagulation is associated with an over 90 percent reduction in the rate of recurrence compared with low-intensity anticoagulant regimens and aspirin which are less effective (approximately 60 and 30 percent rate reduction in recurrence respectively). This decrease in recurrence outweighs the rate of bleeding with full anticoagulation in most patients who are estimated to have a low (0.8 percent per year) or  intermediate (1.6 percent  per year) bleeding risk.  Labs today: CBC diff, CMET, iron/TIBC, ferritin, Factor V Leiden, Prothombin 20210A, Anticardiolipin antibodies, Anti-beta-glycoprotein-I antibody, Protein C+S free and total,  And Antithrombin antigen.  Order is placed for CT angio of chest to confirm resolution of right sided PE.  Return in 3-4 weeks for follow-up and to review all information.  At that time, hematology recommendation regarding anticoagulation length can be provided to the patient.  If she opts against ongoing Xarelto anticoagulation, then ASA will be recommended to reduce the risk of recurrence.  We will need to follow-up with her regarding her mammogram and colonoscopy surveillance.

## 2016-03-11 NOTE — Patient Instructions (Addendum)
[facility] at [facility] Discharge Instructions  RECOMMENDATIONS MADE BY THE CONSULTANT AND ANY TEST RESULTS WILL BE SENT TO YOUR REFERRING PHYSICIAN.  You were seen today by Jenita Seashoreom Kefalas PA-C and Dr. Janyth ContesZhou. Labs today, we will call you with results. CT scan in about 1-2 weeks. Follow up in 3-4 weeks.    Thank you for choosing [facility] at [facility] to provide your oncology and hematology care.  To afford each patient quality time with our provider, please arrive at least 15 minutes before your scheduled appointment time.    If you have a lab appointment with the Cancer Center please come in thru the  Main Entrance and check in at the main information desk  You need to re-schedule your appointment should you arrive 10 or more minutes late.  We strive to give you quality time with our providers, and arriving late affects you and other patients whose appointments are after yours.  Also, if you no show three or more times for appointments you may be dismissed from the clinic at the providers discretion.     Again, thank you for choosing [facility].  Our hope is that these requests will decrease the amount of time that you wait before being seen by our physicians.       _____________________________________________________________  Should you have questions after your visit to [facility], please contact our office at [phone] between the hours of 8:30 a.m. and 4:30 p.m.  Voicemails left after 4:30 p.m. will not be returned until the following business day.  For prescription refill requests, have your pharmacy contact our office.       Resources For Cancer Patients and their Caregivers ? American Cancer Society: Can assist with transportation, wigs, general needs, runs Look Good Feel Better.        ([phone] ? Cancer Care: Provides financial assistance, online support groups, medication/co-pay  assistance.  1-800-813-HOPE (681)322-3598(4673) ? [facility] Assists [place] cancer patients and their families through emotional , educational and financial support.  9840572723(636)013-6279 ? [place] DSS Where to apply for food stamps, Medicaid and utility assistance. 573-156-0839310-027-3762 ? [facility]: Transportation to medical appointments. (763) 267-8665567-874-7885 ? Social Security Administration: May apply for disability if have a Stage IV cancer. ([phone] [phone] [phone] ? [place] [employer], Dis[employer] and Transit Services: Assists with nutrition, care and transit needs. [phone]  Cancer Center Support Programs: @10RELATIVEDAYS @ > Cancer Support Group  2nd Tuesday of the month 1pm-2pm, Journey Room  > Creative Journey  3rd Tuesday of the month 1130am-1pm, Journey Room  > Look Good Feel Better  1st Wednesday of the month 10am-12 noon, Journey Room (Call American Cancer Society to register [phone])

## 2016-03-11 NOTE — Progress Notes (Signed)
[facility] Hematology/Oncology Consultation   Name: Christine Rogers      MRN: [id number]    Location: Room/bed info not found  Date: 03/11/2016 Time:4:46 PM   REFERRING PHYSICIAN:  Dina Rich, MD (Cardiology)  REASON FOR CONSULT:  Pulmonary embolism, unprovoked   DIAGNOSIS:  Acute segmental pulmonary embolism to right middle and lower lobe without CT evidence for right heart strain by CT imaging.  HISTORY OF PRESENT ILLNESS:   Christine Rogers is a 76 y.o. female with a medical history significant for CAD with cath in 2002 showing 50-70% LAD lesion, chronic back pain followed by PCP, HTN, hyperlipidemia, tobacco abuse, emphysema, GERD who is referred to the [facility] for acute segmental pulmonary embolism to right middle and lower lobe without CT evidence for right heart strain by CT imaging.  She reports in September 2017 a sudden onset of right-sided chest pain and progressive shortness of breath with increased chest pain on deep inspiration.  She notes that occurred suddenly and in the late afternoon.  She noted that it progressed over 3 hours which led her to the emergency department at [facility].  She notes that she was admitted for approximately 4 days in the hospital.  It quickly resolved.  She denies any surgery or long travel in the 6 months prior to her diagnosis of acute ulnar embolism.  She denies any history of VTE.  She denies any trauma or central venous catheter.  She denies any changes in her daily activities or increased immobilization.  She denies any diagnosis of malignancy around that time.  She denies any exacerbation of chronic illnesses such as heart failure, heart disease or any bowel disease.  She notes an increase in her weight recently and therefore obesity cannot be considered a risk factor for her.  She denies any new medications that were started in the prior 6 months before her diagnosis of PE.  She notes that this was  her first VTE.    She is tolerating Xarelto well without any complaints associated with bleeding.  She denies any blood in stool, dark sticky stool, hematemesis, hemoptysis, gross hematuria, gingival bleeding, and epistaxis.  Review of Systems  Constitutional: Negative.  Negative for chills, fever and weight loss.  HENT: Negative.   Eyes: Negative.   Respiratory: Negative.  Negative for cough.   Cardiovascular: Negative.  Negative for chest pain.  Gastrointestinal: Negative.  Negative for blood in stool, constipation, diarrhea, melena, nausea and vomiting.  Genitourinary: Negative.   Musculoskeletal: Negative.   Skin: Negative.   Neurological: Negative.  Negative for weakness.  Endo/Heme/Allergies: Bruises/bleeds easily.  Psychiatric/Behavioral: Negative.      PAST MEDICAL HISTORY:   Past Medical History:  Diagnosis Date  . Acute pulmonary embolism (HCC) 10/19/2015  . Bipolar 1 disorder (HCC)   . COPD (chronic obstructive pulmonary disease) (HCC)   . DDD (degenerative disc disease), lumbar   . Depression   . Emphysema of lung (HCC)   . GERD (gastroesophageal reflux disease)   . High cholesterol   . Hypertension   . Sleep apnea     ALLERGIES: Allergies  Allergen Reactions  . Imitrex [Sumatriptan] Anaphylaxis and Swelling  . Ciprofloxacin     UNKNOWN REACTION  . Demerol [Meperidine] Nausea And Vomiting      MEDICATIONS: I have reviewed the patient's current medications.    Current Outpatient Prescriptions on File Prior to Visit  Medication Sig Dispense Refill  .  amLODipine (NORVASC) 5 MG tablet Take 5 mg by mouth daily.     Marland Kitchen atorvastatin (LIPITOR) 20 MG tablet Take 1 tablet (20 mg total) by mouth daily. 90 tablet 3  . cloNIDine (CATAPRES) 0.1 MG tablet Take 0.1 mg by mouth 2 (two) times daily.     Marland Kitchen DEXILANT 60 MG capsule TAKE ONE (1) CAPSULE EACH DAY 30 capsule 5  . fentaNYL (DURAGESIC - DOSED MCG/HR) 100 MCG/HR Place 100 mcg onto the skin every 3 (three) days.      . ondansetron (ZOFRAN) 8 MG tablet Take 8 mg by mouth every 8 (eight) hours as needed.     Marland Kitchen oxycodone (ROXICODONE) 30 MG immediate release tablet Take 30 mg by mouth 4 (four) times daily as needed. FOR PAIN    . promethazine (PHENERGAN) 25 MG tablet Take 25 mg by mouth every 6 (six) hours as needed for nausea or vomiting.    . rivaroxaban (XARELTO) 20 MG TABS tablet Take 20 mg by mouth daily.    Marland Kitchen venlafaxine XR (EFFEXOR-XR) 75 MG 24 hr capsule Take 75 mg by mouth every evening.    . isosorbide mononitrate (IMDUR) 30 MG 24 hr tablet Take 1 tablet (30 mg total) by mouth daily. (Patient not taking: Reported on 03/11/2016) 30 tablet 11  . naloxegol oxalate (MOVANTIK) 12.5 MG TABS tablet Take 12.5 mg by mouth daily.     No current facility-administered medications on file prior to visit.      PAST SURGICAL HISTORY Past Surgical History:  Procedure Laterality Date  . ABDOMINAL HYSTERECTOMY    . BACK SURGERY    . BIOPSY  01/23/2015   Procedure: BIOPSY;  Surgeon: Corbin Ade, MD;  Location: [facility];  Service: Endoscopy;;  gastric biopsies  . ESOPHAGOGASTRODUODENOSCOPY (EGD) WITH PROPOFOL N/A 01/23/2015   RMR: abnormal gastric mucosa.pyloric channel of uncertain significance status post biopys  . TONSILLECTOMY      FAMILY HISTORY: Family History  Problem Relation Age of Onset  . Heart disease Father   . Heart attack Father   . Heart attack Brother   . Heart Problems Brother   . Heart attack Brother   . Cancer Child   . Cerebral palsy Daughter   . Colon cancer Neg Hx    Mother deceased at the age of 81 secondary natural causes Father deceased age of 39 due to heart disease. 3 brothers deceased between the ages of 9-75 secondary to heart disease. One daughter deceased secondary to renal cancer at the age of 110. She is 3 living daughters, oldest with cerebral palsy.  Other is healthy. 7 grandchildren, all healthy.   SOCIAL HISTORY:  reports that she has been smoking  Cigarettes.  She started smoking about 53 years ago. She has a 50.00 pack-year smoking history. She has never used smokeless tobacco. She reports that she does not drink alcohol or use drugs.  She is Control and instrumentation engineer and religion.  She is retired from Chief of Staff work.  She is widowed 5 years.  Husband deceased at the age 85 secondary to heart disease.  She has 3 living children, one deceased.  She has 7 grandchildren.  Social History   Social History  . Marital status: Divorced    Spouse name: N/A  . Number of children: N/A  . Years of education: N/A   Social History Main Topics  . Smoking status: Current Some Day Smoker    Packs/day: 1.00    Years: 50.00    Types: Cigarettes  Start date: 01/30/1963  . Smokeless tobacco: Never Used  . Alcohol use No  . Drug use: No  . Sexual activity: Not Currently    Birth control/ protection: Surgical   Other Topics Concern  . None   Social History Narrative  . None    PERFORMANCE STATUS: The patient's performance status is 0 - Asymptomatic  PHYSICAL EXAM: Most Recent Vital Signs: Blood pressure (!) 143/83, pulse 91, temperature 98.6 F (37 C), temperature source Oral, resp. rate 20, height 5\' 1"  (1.549 m), weight 116 lb 8 oz (52.8 kg), SpO2 97 %. BP (!) 143/83 (BP Location: Left Arm, Patient Position: Sitting)   Pulse 91   Temp 98.6 F (37 C) (Oral)   Resp 20   Ht 5\' 1"  (1.549 m)   Wt 116 lb 8 oz (52.8 kg)   SpO2 97%   BMI 22.01 kg/m   General Appearance:    Alert, cooperative, no distress, appears older than stated age, thickened facial skin.  Head:    Normocephalic, without obvious abnormality, atraumatic  Eyes:    Conjunctiva/corneas clear, EOM's intact, both eyes  Ears:    Normal TM's and external ear canals, both ears  Nose:   Nares normal, septum midline, mucosa normal, no drainage    or sinus tenderness  Throat:   Lips, mucosa, and tongue normal  Neck:   Supple, symmetrical, trachea midline, no adenopathy;    thyroid:   no enlargement/tenderness/nodules; no carotid   bruit or JVD  Back:     Symmetric, no curvature, ROM normal, no CVA tenderness  Lungs:     Clear to auscultation bilaterally, respirations unlabored  Chest Wall:    No tenderness or deformity   Heart:    Regular rate and rhythm, S1 and S2 normal, no murmur, rub   or gallop  Breast Exam:    Not examined  Abdomen:     Soft, non-tender, bowel sounds active all four quadrants,    no masses, no organomegaly  Genitalia:    Not examined  Rectal:    Not examined  Extremities:   Extremities normal, atraumatic, no cyanosis or edema  Pulses:   2+ and symmetric all extremities  Skin:   Skin color, texture, turgor normal, no rashes or lesions  Lymph nodes:   Cervical, supraclavicular, and axillary nodes normal  Neurologic:   CNII-XII intact, normal strength, sensation and reflexes    throughout    LABORATORY DATA:  No results found for this or any previous visit (from the past 48 hour(s)).    RADIOGRAPHY:         PATHOLOGY:  N/A  ASSESSMENT/PLAN:   Acute pulmonary embolism (HCC) Acute segmental pulmonary embolism to right middle and lower lobe without CT evidence for right heart strain on 09/25/2015, anticoagulated with Xarelto.  Acquired risk factors for predisposing conditions for thrombosis including prior thrombotic event, recent major surgery (specifically orthopedic), presence of central venous catheter, trauma, immobilization, malignancy, pregnancy, the use of oral contraceptives or heparin, myeloproliferative disorders, antiphospholipid syndrome (APS), and a number of major medical illnesses (heart failure, congenital heart disease, age greater than or equal to 65 years, obesity, PNH, inflammatory bowel disease, nephrotic syndrome).    Many patients with an episode of ETE have more than 1 acquired risk factor from for thrombosis.  This was shown in a population-based study of the incidence of ETE and residents of [place],  [place] during 1999.  6 most prevalent pre-existing medical characteristics of patients in the study were:  More than 48 hours of immobility in the preceding month-45%  Hospital admission in the past 3 months-39%  Surgery in the past 3 months-34%  Malignancy in the past 3 months-34%  Infection the past 3 months-34%  Current hospitalization-26%. Only 11% of the 587 episodes of ETE had none of the 6 characteristics present, 36 and 53% had 1-2 and >/= 3 risk factors, respectively.  The following coag tests are reliable in the setting of anticoagulation while on Xarelto, Pradaxa, or Eliquis:  1. Factor V Leiden  2. Prothombin 20210A  3. Anticardiolipin antibody  4. Anti-beta-glycoprotein-I antibody  5. Protein C+S free and total  6. Antithrombin antigen.  The following coag tests are not reliable in the setting of Xarelto, Pradaxa, or Eliquis:  1. Protein C+S activity (falsely normal)  2. Antithrombin activity (falsely elevated)  3. APC resistance (flsely negative)  4. Lupus anticoagulant tests (unknwon their reliability in the setting of Xarelto, Pradaxa, Eliquis)  Unprovoked proximal DVT and symptomatic PE-The decision regarding the use of indefinite anticoagulation in patients with a first episode of unprovoked proximal deep vein thrombosis (DVT), unprovoked symptomatic pulmonary embolism (PE), or patients with active cancer is dependent upon patient-specific bleeding and thrombotic risks as well as the patient's values and preferences. For those with a low to moderate bleeding risk, we suggest indefinite treatment rather than treatment for three to six months. For patients with a high bleeding risk, the benefits of indefinite anticoagulation are likely outweighed by the high risk of bleeding such that indefinite anticoagulation is not generally performed.  The rationale for indefinite anticoagulation in patients with an unprovoked proximal DVT or symptomatic PE is based upon the high  estimated lifetime risk of recurrent VTE, which can be dramatically reduced by extending anticoagulation beyond the conventional three to six months. Importantly, most studies report a risk reduction in the rate of VTE recurrence at the expense of an increased rate of bleeding and without mortality benefit.  The estimated risk of recurrence following cessation of anticoagulation in patients with a first unprovoked episode of VTE is 10 percent at one year and 30 percent at five years (approximately 5 percent per year after the first year). Prior duration of therapy does not affect the risk of recurrence once anticoagulant therapy is discontinued. Full anticoagulation is associated with an over 90 percent reduction in the rate of recurrence compared with low-intensity anticoagulant regimens and aspirin which are less effective (approximately 60 and 30 percent rate reduction in recurrence respectively). This decrease in recurrence outweighs the rate of bleeding with full anticoagulation in most patients who are estimated to have a low (0.8 percent per year) or intermediate (1.6 percent per year) bleeding risk.  Labs today: CBC diff, CMET, iron/TIBC, ferritin, Factor V Leiden, Prothombin 20210A, Anticardiolipin antibodies, Anti-beta-glycoprotein-I antibody, Protein C+S free and total,  And Antithrombin antigen.  Order is placed for CT angio of chest to confirm resolution of right sided PE.  Return in 3-4 weeks for follow-up and to review all information.  At that time, hematology recommendation regarding anticoagulation length can be provided to the patient.  If she opts against ongoing Xarelto anticoagulation, then ASA will be recommended to reduce the risk of recurrence.  We will need to follow-up with her regarding her mammogram and colonoscopy surveillance.   ORDERS PLACED FOR THIS ENCOUNTER: Orders Placed This Encounter  Procedures  . CT ANGIO CHEST PE W OR WO CONTRAST  . CBC with Differential  .  Comprehensive metabolic panel  . Iron  and TIBC  . Ferritin  . Factor 5 leiden  . Prothrombin gene mutation  . Cardiolipin antibodies, IgG, IgM, IgA  . Beta-2-glycoprotein i abs, IgG/M/A  . Protein C, total  . Protein S, total  . Antithrombin III antigen    MEDICATIONS PRESCRIBED THIS ENCOUNTER: Meds ordered this encounter  Medications  . clonazePAM (KLONOPIN) 0.5 MG tablet    All questions were answered. The patient knows to call the clinic with any problems, questions or concerns. We can certainly see the patient much sooner if necessary.  Patient discussed with Dr. Janyth Contes and together we ascertained an up-to-date interval history, and examined the patient.  Dr. Janyth Contes developed the patient's assessment and plan.  This was a shared visit-consultation.  Her attestation will follow below.  This note is electronically signed by: Tina Griffiths 03/11/2016 4:46 PM

## 2016-03-12 ENCOUNTER — Other Ambulatory Visit (HOSPITAL_COMMUNITY): Payer: Self-pay | Admitting: Oncology

## 2016-03-12 DIAGNOSIS — E876 Hypokalemia: Secondary | ICD-10-CM

## 2016-03-12 LAB — ANTITHROMBIN III ANTIGEN: AT III AG PPP IMM-ACNC: 106 % (ref 72–124)

## 2016-03-12 LAB — PROTEIN S, TOTAL: Protein S Ag, Total: 139 % (ref 60–150)

## 2016-03-12 MED ORDER — POTASSIUM CHLORIDE CRYS ER 20 MEQ PO TBCR: 40.0000 meq | EXTENDED_RELEASE_TABLET | Freq: Every day | ORAL | 0 refills | Status: DC

## 2016-03-13 LAB — CARDIOLIPIN ANTIBODIES, IGG, IGM, IGA
ANTICARDIOLIPIN IGM: 11 [MPL'U]/mL (ref 0–12)
Anticardiolipin IgG: <9 GPL U/mL (ref 0–14)

## 2016-03-14 LAB — FACTOR 5 LEIDEN

## 2016-03-14 LAB — PROTEIN C, TOTAL: Protein C, Total: 82 % (ref 60–150)

## 2016-03-14 LAB — BETA-2-GLYCOPROTEIN I ABS, IGG/M/A
BETA-2-GLYCOPROTEIN I IGM: 9 GPI IgM units (ref 0–32)
Beta-2 Glyco I IgG: <9 GPI IgG units (ref 0–20)

## 2016-03-18 LAB — PROTHROMBIN GENE MUTATION

## 2016-03-20 ENCOUNTER — Ambulatory Visit (HOSPITAL_COMMUNITY)
Admission: RE | Admit: 2016-03-20 | Discharge: 2016-03-20 | Disposition: A | Discharge status: Home / Self Care | Payer: Medicare Other | Source: Ambulatory Visit | Attending: Oncology | Admitting: Oncology

## 2016-03-20 DIAGNOSIS — K838 Other specified diseases of biliary tract: Secondary | ICD-10-CM | POA: Diagnosis not present

## 2016-03-20 DIAGNOSIS — I2699 Other pulmonary embolism without acute cor pulmonale: Secondary | ICD-10-CM | POA: Diagnosis present

## 2016-03-20 DIAGNOSIS — Z86711 Personal history of pulmonary embolism: Secondary | ICD-10-CM | POA: Insufficient documentation

## 2016-03-20 MED ORDER — IOPAMIDOL (ISOVUE-370) INJECTION 76%
100.0000 mL | Freq: Once | INTRAVENOUS | Status: AC | PRN
  Administered 2016-03-20: 100 mL via INTRAVENOUS

## 2016-04-02 ENCOUNTER — Encounter (HOSPITAL_COMMUNITY): Payer: Self-pay

## 2016-04-02 ENCOUNTER — Encounter (HOSPITAL_BASED_OUTPATIENT_CLINIC_OR_DEPARTMENT_OTHER): Payer: Medicare Other | Admitting: Oncology

## 2016-04-02 VITALS — BP 135/73 | HR 83 | Temp 98.4°F | Resp 20 | Wt 117.7 lb

## 2016-04-02 DIAGNOSIS — Z86711 Personal history of pulmonary embolism: Secondary | ICD-10-CM

## 2016-04-02 DIAGNOSIS — I2699 Other pulmonary embolism without acute cor pulmonale: Secondary | ICD-10-CM

## 2016-04-02 NOTE — Patient Instructions (Signed)
[facility] Discharge Instructions  RECOMMENDATIONS MADE BY THE CONSULTANT AND ANY TEST RESULTS WILL BE SENT TO YOUR REFERRING PHYSICIAN.  You were seen today by Dr. Louise Zhou Follow up as needed See Amy up front for appointments   Thank you for choosing [facility] to provide your oncology and hematology care.  To afford each patient quality time with our provider, please arrive at least 15 minutes before your scheduled appointment time.    If you have a lab appointment with the Cancer Center please come in thru the  Main Entrance and check in at the main information desk  You need to re-schedule your appointment should you arrive 10 or more minutes late.  We strive to give you quality time with our providers, and arriving late affects you and other patients whose appointments are after yours.  Also, if you no show three or more times for appointments you may be dismissed from the clinic at the providers discretion.     Again, thank you for choosing [facility].  Our hope is that these requests will decrease the amount of time that you wait before being seen by our physicians.       _____________________________________________________________  Should you have questions after your visit to [facility], please contact our office at [phone] between the hours of 8:30 a.m. and 4:30 p.m.  Voicemails left after 4:30 p.m. will not be returned until the following business day.  For prescription refill requests, have your pharmacy contact our office.       Resources For Cancer Patients and their Caregivers ? American Cancer Society: Can assist with transportation, wigs, general needs, runs Look Good Feel Better.        1-888-227-6333 ? Cancer Care: Provides financial assistance, online support groups, medication/co-pay assistance.  1-800-813-HOPE (4673) ? [facility] Assists [place] cancer patients and their families through emotional , educational and financial support.  336-427-4357 ? [place] DSS Where to apply for food stamps, Medicaid and utility assistance. 336-342-1394 ? [facility]: Transportation to medical appointments. 336-347-2287 ? Social Security Administration: May apply for disability if have a Stage IV cancer. 336-342-7796 1-800-772-1213 ? [place] Aging, Disability and Transit Services: Assists with nutrition, care and transit needs. 336-349-2343  Cancer Center Support Programs: @[email or web address]@ > Cancer Support Group  2nd Tuesday of the month 1pm-2pm, Journey Room  > Creative Journey  3rd Tuesday of the month 1130am-1pm, Journey Room  > Look Good Feel Better  1st Wednesday of the month 10am-12 noon, Journey Room (Call American Cancer Society to register 1-800-395-5775)    

## 2016-04-02 NOTE — Progress Notes (Signed)
PROGRESS NOTE  Christine Jester, DO [address] [place] [address] / [place] [address]  REFERRING PHYSICIAN:  Dina Rich, MD (Cardiology)  REASON FOR CONSULT:  Pulmonary embolism, unprovoked   DIAGNOSIS:  Acute segmental pulmonary embolism to right middle and lower lobe without CT evidence for right heart strain by CT imaging.  HISTORY OF PRESENT ILLNESS: Christine Rogers 76 y.o. female returns for f/u of pulmonary embolism.   She is doing well today. She presented to the hospital about 1.5 weeks ago after her son found her on the floor, and she was confused. She was hospitalized and it was found that the cause of her AMS was a UTI. She is feeling much better and has finished taking the antibiotics. Pt has been bruising very easily. She does have some occasional abdominal pain. Denies any other concerns.   MEDICAL HISTORY: Past Medical History:  Diagnosis Date  . Acute pulmonary embolism (HCC) 10/19/2015  . Bipolar 1 disorder (HCC)   . COPD (chronic obstructive pulmonary disease) (HCC)   . DDD (degenerative disc disease), lumbar   . Depression   . Emphysema of lung (HCC)   . GERD (gastroesophageal reflux disease)   . High cholesterol   . Hypertension   . Sleep apnea     SURGICAL HISTORY: Past Surgical History:  Procedure Laterality Date  . ABDOMINAL HYSTERECTOMY    . BACK SURGERY    . BIOPSY  01/23/2015   Procedure: BIOPSY;  Surgeon: Corbin Ade, MD;  Location: [facility];  Service: Endoscopy;;  gastric biopsies  . ESOPHAGOGASTRODUODENOSCOPY (EGD) WITH PROPOFOL N/A 01/23/2015   RMR: abnormal gastric mucosa.pyloric channel of uncertain significance status post biopys  . TONSILLECTOMY      SOCIAL HISTORY: Social History   Social History  . Marital status: Divorced    Spouse name: N/A  . Number of children: N/A  . Years of education: N/A   Occupational History  . Not on file.   Social History Main Topics  . Smoking status: Current Some Day Smoker    Packs/day: 1.00      Years: 50.00    Types: Cigarettes    Start date: 01/30/1963  . Smokeless tobacco: Never Used  . Alcohol use No  . Drug use: No  . Sexual activity: Not Currently    Birth control/ protection: Surgical   Other Topics Concern  . Not on file   Social History Narrative  . No narrative on file   reports that she has been smoking Cigarettes.  She started smoking about 53 years ago. She has a 50.00 pack-year smoking history. She has never used smokeless tobacco. She reports that she does not drink alcohol or use drugs.  She is Control and instrumentation engineer and religion.  She is retired from Chief of Staff work.  She is widowed 5 years.  Husband deceased at the age 66 secondary to heart disease.  She has 3 living children, one deceased.  She has 7 grandchildren.  FAMILY HISTORY: Family History  Problem Relation Age of Onset  . Heart disease Father   . Heart attack Father   . Heart attack Brother   . Heart Problems Brother   . Heart attack Brother   . Cancer Child   . Cerebral palsy Daughter   . Colon cancer Neg Hx   Mother deceased at the age of 76 secondary natural causes Father deceased age of 3 due to heart disease. 3 brothers deceased between the ages of 11-75 secondary to heart disease.  One daughter deceased secondary to renal cancer at the age of 87. She is 3 living daughters, oldest with cerebral palsy.  Other is healthy. 7 grandchildren, all healthy.  Review of Systems  Constitutional: Negative.   HENT: Negative.   Eyes: Negative.   Respiratory: Negative.   Cardiovascular: Negative.   Gastrointestinal: Positive for abdominal pain.  Genitourinary: Negative.   Musculoskeletal: Negative.   Skin: Negative.   Neurological: Negative.   Endo/Heme/Allergies: Bruises/bleeds easily.  Psychiatric/Behavioral: Negative.   All other systems reviewed and are negative.  14 point review of systems was performed and is negative except as detailed under history of present illness and above  PHYSICAL  EXAMINATION  ECOG PERFORMANCE STATUS: 0 - Asymptomatic  Vitals:   04/02/16 1505  BP: 135/73  Pulse: 83  Resp: 20  Temp: 98.4 F (36.9 C)    Physical Exam  Constitutional: She is oriented to person, place, and time and well-developed, well-nourished, and in no distress.  HENT:  Head: Normocephalic and atraumatic.  Eyes: EOM are normal. Pupils are equal, round, and reactive to light.  Neck: Normal range of motion. Neck supple.  Cardiovascular: Normal rate, regular rhythm and normal heart sounds.   Pulmonary/Chest: Effort normal and breath sounds normal.  Abdominal: Soft. Bowel sounds are normal.  Musculoskeletal: Normal range of motion.  Neurological: She is alert and oriented to person, place, and time. Gait normal.  Skin: Skin is warm and dry.  Large ecchymosis on bilateral forearms. Patient does have very thin skin.  Nursing note and vitals reviewed.  LABORATORY DATA:  CBC    Component Value Date/Time   WBC 10.1 03/11/2016 1239   RBC 4.21 03/11/2016 1239   HGB 12.7 03/11/2016 1239   HCT 38.3 03/11/2016 1239   PLT 279 03/11/2016 1239   MCV 91.0 03/11/2016 1239   MCH 30.2 03/11/2016 1239   MCHC 33.2 03/11/2016 1239   RDW 14.6 03/11/2016 1239   LYMPHSABS 1.6 03/11/2016 1239   MONOABS 0.7 03/11/2016 1239   EOSABS 0.1 03/11/2016 1239   BASOSABS 0.1 03/11/2016 1239   CMP     Component Value Date/Time   NA 135 03/11/2016 1239   K 3.1 (L) 03/11/2016 1239   CL 101 03/11/2016 1239   CO2 24 03/11/2016 1239   GLUCOSE 85 03/11/2016 1239   BUN 9 03/11/2016 1239   CREATININE 0.96 03/11/2016 1239   CALCIUM 8.6 (L) 03/11/2016 1239   PROT 6.7 03/11/2016 1239   ALBUMIN 3.4 (L) 03/11/2016 1239   AST 17 03/11/2016 1239   ALT 6 (L) 03/11/2016 1239   ALKPHOS 126 03/11/2016 1239   BILITOT 0.2 (L) 03/11/2016 1239   GFRNONAA 57 (L) 03/11/2016 1239   GFRAA >60 03/11/2016 1239    RADIOGRAPHIC STUDIES: I have personally reviewed the radiological images as listed and agreed  with the findings in the report.     CTA chest w/ and w/o contrast 03/20/16: IMPRESSION: 1. Resolution of right-sided pulmonary emboli. 2.  No acute process in the chest. 3. Moderate intrahepatic biliary duct dilatation, grossly similar back to 05/10/2014. Biliary system incompletely imaged. Given stability, this is likely within normal variation. Consider correlation with bilirubin levels.  ASSESSMENT and THERAPY PLAN:  Acute pulmonary embolism (HCC) Acute segmental pulmonary embolism to right middle and lower lobe without CT evidence for right heart strain on 09/25/2015, anticoagulated with Xarelto.  The following coag tests were performed while patient was on xarelto and they were all WNL.  1. Factor V Leiden             2. Prothombin 20210A             3. Anticardiolipin antibody             4. Anti-beta-glycoprotein-I antibody             5. Protein C+S free and total             6. Antithrombin antigen.  Labs reviewed with the patient in detail.   Reviewed CTA with the patient as well which demonstrated complete resolution of her right sided PE. Patient has completed 6 months of anticoagulation with Xarelto. She is at high risk for bleeding complications given her age and unsteady gait.   I have advised her to stop taking Xarelto since she has completed therapy.   She will return for follow up PRN. Discharge to her PCP.     All questions were answered. The patient knows to call the clinic with any problems, questions or concerns. We can certainly see the patient much sooner if necessary.  This document serves as a record of services personally performed by Ralene CorkLouise Daelynn Blower, MD. It was created on her behalf by [place] Casey, a trained medical scribe. The creation of this record is based on the scribe's personal observations and the provider's statements to them. This document has been checked and approved by the attending provider.  I have reviewed the above  documentation for accuracy and completeness and I agree with the above.  This note was electronically signed.  [place] Elwin MochaM Casey 04/02/2016

## 2016-04-11 ENCOUNTER — Other Ambulatory Visit: Payer: Self-pay | Admitting: Nurse Practitioner

## 2016-07-16 ENCOUNTER — Other Ambulatory Visit: Payer: Self-pay | Admitting: Physician Assistant

## 2016-07-16 DIAGNOSIS — M5416 Radiculopathy, lumbar region: Secondary | ICD-10-CM

## 2016-07-23 ENCOUNTER — Other Ambulatory Visit: Payer: Medicare Other

## 2016-07-25 ENCOUNTER — Ambulatory Visit: Payer: Medicare Other

## 2016-07-31 ENCOUNTER — Other Ambulatory Visit (HOSPITAL_COMMUNITY): Payer: Self-pay | Admitting: *Deleted

## 2016-07-31 DIAGNOSIS — I2781 Cor pulmonale (chronic): Secondary | ICD-10-CM

## 2016-07-31 DIAGNOSIS — R1011 Right upper quadrant pain: Secondary | ICD-10-CM

## 2016-08-05 ENCOUNTER — Other Ambulatory Visit (HOSPITAL_COMMUNITY): Payer: Medicare Other

## 2016-08-05 ENCOUNTER — Ambulatory Visit (HOSPITAL_COMMUNITY): Payer: Medicare Other | Attending: *Deleted

## 2016-08-05 ENCOUNTER — Encounter (HOSPITAL_COMMUNITY): Payer: Medicare Other

## 2016-08-06 ENCOUNTER — Other Ambulatory Visit (HOSPITAL_COMMUNITY): Payer: Self-pay | Admitting: *Deleted

## 2016-08-06 ENCOUNTER — Other Ambulatory Visit (HOSPITAL_COMMUNITY): Payer: Medicare Other

## 2016-08-06 DIAGNOSIS — M25552 Pain in left hip: Secondary | ICD-10-CM

## 2016-08-07 ENCOUNTER — Encounter (HOSPITAL_COMMUNITY)
Admission: RE | Admit: 2016-08-07 | Discharge: 2016-08-07 | Disposition: A | Discharge status: Home / Self Care | Payer: Medicare Other | Source: Ambulatory Visit | Attending: *Deleted | Admitting: *Deleted

## 2016-08-07 ENCOUNTER — Ambulatory Visit (HOSPITAL_COMMUNITY)
Admission: RE | Admit: 2016-08-07 | Discharge: 2016-08-07 | Disposition: A | Discharge status: Home / Self Care | Payer: Medicare Other | Source: Ambulatory Visit | Attending: *Deleted | Admitting: *Deleted

## 2016-08-07 ENCOUNTER — Encounter (HOSPITAL_COMMUNITY): Payer: Self-pay

## 2016-08-07 DIAGNOSIS — R1011 Right upper quadrant pain: Secondary | ICD-10-CM | POA: Insufficient documentation

## 2016-08-07 DIAGNOSIS — I2781 Cor pulmonale (chronic): Secondary | ICD-10-CM | POA: Insufficient documentation

## 2016-08-07 DIAGNOSIS — I739 Peripheral vascular disease, unspecified: Secondary | ICD-10-CM | POA: Insufficient documentation

## 2016-08-07 DIAGNOSIS — M25552 Pain in left hip: Secondary | ICD-10-CM | POA: Insufficient documentation

## 2016-08-07 MED ORDER — MORPHINE SULFATE (PF) 2 MG/ML IV SOLN
2.0000 mg | Freq: Once | INTRAVENOUS | Status: AC
  Administered 2016-08-07: 2 mg via INTRAVENOUS

## 2016-08-07 MED ORDER — MORPHINE SULFATE (PF) 2 MG/ML IV SOLN
INTRAVENOUS | Status: AC
  Filled 2016-08-07: qty 1

## 2016-08-07 MED ORDER — TECHNETIUM TC 99M MEBROFENIN IV KIT
5.0000 | PACK | Freq: Once | INTRAVENOUS | Status: AC | PRN
  Administered 2016-08-07: 5.2 via INTRAVENOUS

## 2016-08-13 ENCOUNTER — Ambulatory Visit (HOSPITAL_COMMUNITY)
Admission: RE | Admit: 2016-08-13 | Discharge: 2016-08-13 | Disposition: A | Discharge status: Home / Self Care | Payer: Medicare Other | Source: Ambulatory Visit | Attending: *Deleted | Admitting: *Deleted

## 2016-08-13 DIAGNOSIS — Z720 Tobacco use: Secondary | ICD-10-CM | POA: Insufficient documentation

## 2016-08-13 DIAGNOSIS — K219 Gastro-esophageal reflux disease without esophagitis: Secondary | ICD-10-CM | POA: Insufficient documentation

## 2016-08-13 DIAGNOSIS — Z86711 Personal history of pulmonary embolism: Secondary | ICD-10-CM | POA: Insufficient documentation

## 2016-08-13 DIAGNOSIS — I119 Hypertensive heart disease without heart failure: Secondary | ICD-10-CM | POA: Diagnosis not present

## 2016-08-13 DIAGNOSIS — I2781 Cor pulmonale (chronic): Secondary | ICD-10-CM | POA: Diagnosis present

## 2016-08-13 NOTE — Progress Notes (Signed)
*  PRELIMINARY RESULTS* Echocardiogram 2D Echocardiogram has been performed.  Jeryl Columbialliott, Hasheem Voland 08/13/2016, 12:03 PM

## 2016-09-03 ENCOUNTER — Ambulatory Visit: Payer: Medicare Other | Admitting: Internal Medicine

## 2016-10-08 ENCOUNTER — Encounter: Payer: Self-pay | Admitting: Internal Medicine

## 2016-10-08 ENCOUNTER — Ambulatory Visit (INDEPENDENT_AMBULATORY_CARE_PROVIDER_SITE_OTHER): Payer: Medicare Other | Admitting: Internal Medicine

## 2016-10-08 VITALS — BP 167/102 | HR 105 | Temp 97.5°F | Ht 60.0 in | Wt 105.4 lb

## 2016-10-08 DIAGNOSIS — R1011 Right upper quadrant pain: Secondary | ICD-10-CM

## 2016-10-08 DIAGNOSIS — K5909 Other constipation: Secondary | ICD-10-CM

## 2016-10-08 DIAGNOSIS — G8929 Other chronic pain: Secondary | ICD-10-CM | POA: Diagnosis not present

## 2016-10-08 NOTE — Patient Instructions (Addendum)
Get back on your regular medications - your BP is elevated  Encourage good nutrition and oral intake  OV in 6 weeks to re-assess weight, etc  Your gallbladder may be part of the problem

## 2016-10-08 NOTE — Progress Notes (Signed)
Primary Care Physician:  [name], DO Primary Gastroenterologist:  Dr. Jena Gauss  Pre-Procedure History & Physical: HPI:  Christine Rogers is a 76 y.o. female here for follow-up of right upper quadrant abdominal pain, GERD and constipation. Last seen over a year ago. Patient states that since she was seen, she's had trouble with multitude of side effects related to cutaneous fentanyl. She weaned herself off of fentanyl patches on her own. While she was taking it,  symptoms of nausea and anorexia were prominent. In fact, she has lost 9 pounds since she was last year. Intermittent constipation. More recently taking milk of magnesia. Just today, started on Linzess(dose unknown) by her PCP per patient report.  Intermittent right upper quadrant abdominal pain;  HIDA  2 months again demonstrated no gallbladder visualization after morphine augmentation. No evidence of inflammation of the gallbladder on CT which was done previously. Again, abdominal pain seems to have resolved. She states she can't eat. No pain with eating just doesn't have an appetite recently. States nausea which she associated with the nail has improved significantly.  Of note,  patient had a HIDA scan with CCK challenge about a year and a half ago which revealed rapid imaging of the gallbladder and an EF of 68%.  Diagnosis of pulmonary embolus since she was last seen here and is now anticoagulated.  Past Medical History:  Diagnosis Date  . Acute pulmonary embolism (HCC) 10/19/2015  . Bipolar 1 disorder (HCC)   . COPD (chronic obstructive pulmonary disease) (HCC)   . DDD (degenerative disc disease), lumbar   . Depression   . Emphysema of lung (HCC)   . GERD (gastroesophageal reflux disease)   . High cholesterol   . Hypertension   . Sleep apnea     Past Surgical History:  Procedure Laterality Date  . ABDOMINAL HYSTERECTOMY    . BACK SURGERY    . BIOPSY  01/23/2015   Procedure: BIOPSY;  Surgeon: Corbin Ade, MD;   [facility]: [facility];  Service: Endoscopy;;  gastric biopsies  . ESOPHAGOGASTRODUODENOSCOPY (EGD) WITH PROPOFOL N/A 01/23/2015   RMR: abnormal gastric mucosa.pyloric channel of uncertain significance status post biopys  . TONSILLECTOMY      Prior to Admission medications   Medication Sig Start Date End Date Taking? Authorizing Provider  amLODipine (NORVASC) 5 MG tablet Take 5 mg by mouth daily.  01/17/16  Yes [provider]  cloNIDine (CATAPRES) 0.1 MG tablet Take 0.1 mg by mouth 2 (two) times daily.  01/02/16  Yes [provider]  DEXILANT 60 MG capsule TAKE ONE (1) CAPSULE EACH DAY 04/11/16  Yes Gelene Mink, NP  Linaclotide (LINZESS PO) Take by mouth daily. Linzess by mouth daily (pt unsure of dose, PCP prescribed 10/08/16)   Yes [provider]  ondansetron (ZOFRAN) 8 MG tablet Take 8 mg by mouth every 8 (eight) hours as needed.  01/17/16  Yes [provider]  oxycodone (ROXICODONE) 30 MG immediate release tablet Take 30 mg by mouth 4 (four) times daily as needed. FOR PAIN 04/08/14  Yes [provider]  promethazine (PHENERGAN) 25 MG tablet Take 25 mg by mouth every 6 (six) hours as needed for nausea or vomiting.   Yes [provider]  atorvastatin (LIPITOR) 20 MG tablet Take 1 tablet (20 mg total) by mouth daily. 01/30/16 04/29/16  Antoine Poche, MD  clonazePAM (KLONOPIN) 0.5 MG tablet  02/12/16   [provider]  fentaNYL (DURAGESIC - DOSED MCG/HR) 100  MCG/HR Place 100 mcg onto the skin every 3 (three) days.    [provider]  isosorbide mononitrate (IMDUR) 30 MG 24 hr tablet Take 1 tablet (30 mg total) by mouth daily. Patient not taking: Reported on 10/08/2016 05/30/15   Croitoru, Mihai, MD  naloxegol oxalate (MOVANTIK) 12.5 MG TABS tablet Take 12.5 mg by mouth daily.    [provider]  potassium chloride SA (K-DUR,KLOR-CON) 20 MEQ tablet Take 2 tablets (40 mEq total) by mouth daily. Patient not taking:  Reported on 10/08/2016 03/12/16   Ellouise Newer, PA-C  rivaroxaban (XARELTO) 20 MG TABS tablet Take 20 mg by mouth daily.    [provider]  venlafaxine XR (EFFEXOR-XR) 75 MG 24 hr capsule Take 75 mg by mouth every evening.    [provider]    Allergies as of 10/08/2016 - Review Complete 10/08/2016  Allergen Reaction Noted  . Imitrex [sumatriptan] Anaphylaxis and Swelling 04/10/2014  . Ciprofloxacin    . Demerol [meperidine] Nausea And Vomiting 05/30/2015    Family History  Problem Relation Age of Onset  . Heart disease Father   . Heart attack Father   . Heart attack Brother   . Heart Problems Brother   . Heart attack Brother   . Cancer Child   . Cerebral palsy Daughter   . Colon cancer Neg Hx     Social History   Social History  . Marital status: Divorced    Spouse name: N/A  . Number of children: N/A  . Years of education: N/A   Occupational History  . Not on file.   Social History Main Topics  . Smoking status: Current Some Day Smoker    Packs/day: 1.00    Years: 50.00    Types: Cigarettes    Start date: 01/30/1963  . Smokeless tobacco: Never Used  . Alcohol use No  . Drug use: No  . Sexual activity: Not Currently    Birth control/ protection: Surgical   Other Topics Concern  . Not on file   Social History Narrative  . No narrative on file    Review of Systems: See HPI, otherwise negative ROS  Physical Exam: BP (!) 167/102   Pulse (!) 105   Temp (!) 97.5 F (36.4 C) (Oral)   Ht 5' (1.524 m)   Wt 105 lb 6.4 oz (47.8 kg)   BMI 20.58 kg/m  General:  Thin, chronically ill appearing lady who appears to be in no acute distress. Neck:  Supple; no masses or thyromegaly. No significant cervical adenopathy. Lungs:  Clear throughout to auscultation.   No wheezes, crackles, or rhonchi. No acute distress. Heart:  Regular rate and rhythm; no murmurs, clicks, rubs,  or gallops. Abdomen: Non-distended, normal bowel sounds.  Soft and  nontender without appreciable mass or hepatosplenomegaly.  Pulses:  Normal pulses noted. Extremities:  Without clubbing or edema.  Impression:  Nausea anorexia and intermittent right upper quadrant abdominal pain. She feels her symptoms have improved with cessation of cutaneous fentanyl. She did have an abnormal HIDA recently which may well indicate a degree of chronic cholecystitis. HIDA findings more recently dramatically different than that seen a year and a half ago. Aside from anorexia, GI symptoms appear to be improved. Her weight loss is bothersome, however.  Some of her GI symptoms could be related to her gallbladder. Blood pressure elevated today but didn't take her antihypertensive medications.  Recommendations: Get back on  regular medications -BP elevated today. Encourage good nutrition and  oral intake  OV in 6 weeks to re-assess weight, etc.  Continue current bowel regimen.  Would not rule out the possibility of surgical referral if she has ongoing stuttering symptoms right upper quadrant abdominal pain.        Notice: This dictation was prepared with Dragon dictation along with smaller phrase technology. Any transcriptional errors that result from this process are unintentional and may not be corrected upon review.

## 2016-10-31 ENCOUNTER — Emergency Department (HOSPITAL_COMMUNITY): Payer: Medicare Other

## 2016-10-31 ENCOUNTER — Encounter (HOSPITAL_COMMUNITY): Payer: Self-pay

## 2016-10-31 ENCOUNTER — Emergency Department (HOSPITAL_COMMUNITY)
Admission: EM | Admit: 2016-10-31 | Discharge: 2016-10-31 | Disposition: A | Discharge status: Home / Self Care | Payer: Medicare Other | Attending: Emergency Medicine | Admitting: Emergency Medicine

## 2016-10-31 DIAGNOSIS — Z79899 Other long term (current) drug therapy: Secondary | ICD-10-CM | POA: Insufficient documentation

## 2016-10-31 DIAGNOSIS — R1110 Vomiting, unspecified: Secondary | ICD-10-CM

## 2016-10-31 DIAGNOSIS — J449 Chronic obstructive pulmonary disease, unspecified: Secondary | ICD-10-CM | POA: Insufficient documentation

## 2016-10-31 DIAGNOSIS — I10 Essential (primary) hypertension: Secondary | ICD-10-CM | POA: Insufficient documentation

## 2016-10-31 DIAGNOSIS — K838 Other specified diseases of biliary tract: Secondary | ICD-10-CM | POA: Insufficient documentation

## 2016-10-31 DIAGNOSIS — R112 Nausea with vomiting, unspecified: Secondary | ICD-10-CM | POA: Insufficient documentation

## 2016-10-31 DIAGNOSIS — F17210 Nicotine dependence, cigarettes, uncomplicated: Secondary | ICD-10-CM | POA: Insufficient documentation

## 2016-10-31 LAB — COMPREHENSIVE METABOLIC PANEL
ALK PHOS: 84 U/L (ref 38–126)
ALT: 8 U/L — ABNORMAL LOW (ref 14–54)
AST: 15 U/L (ref 15–41)
Albumin: 3.6 g/dL (ref 3.5–5.0)
Anion gap: 10 (ref 5–15)
BUN: 12 mg/dL (ref 6–20)
CALCIUM: 8.8 mg/dL — AB (ref 8.9–10.3)
CO2: 22 mmol/L (ref 22–32)
CREATININE: 0.78 mg/dL (ref 0.44–1.00)
Chloride: 109 mmol/L (ref 101–111)
Glucose, Bld: 86 mg/dL (ref 65–99)
Potassium: 4.1 mmol/L (ref 3.5–5.1)
Sodium: 141 mmol/L (ref 135–145)
Total Bilirubin: 0.3 mg/dL (ref 0.3–1.2)
Total Protein: 6.6 g/dL (ref 6.5–8.1)

## 2016-10-31 LAB — CBC WITH DIFFERENTIAL/PLATELET
Basophils Absolute: 0.1 10*3/uL (ref 0.0–0.1)
Basophils Relative: 1 %
Eosinophils Absolute: 0.1 10*3/uL (ref 0.0–0.7)
Eosinophils Relative: 1 %
HCT: 37.4 % (ref 36.0–46.0)
HEMOGLOBIN: 12.5 g/dL (ref 12.0–15.0)
LYMPHS ABS: 2.1 10*3/uL (ref 0.7–4.0)
LYMPHS PCT: 29 %
MCH: 31.5 pg (ref 26.0–34.0)
MCHC: 33.4 g/dL (ref 30.0–36.0)
MCV: 94.2 fL (ref 78.0–100.0)
Monocytes Absolute: 0.5 10*3/uL (ref 0.1–1.0)
Monocytes Relative: 7 %
NEUTROS ABS: 4.4 10*3/uL (ref 1.7–7.7)
NEUTROS PCT: 62 %
Platelets: 258 10*3/uL (ref 150–400)
RBC: 3.97 MIL/uL (ref 3.87–5.11)
RDW: 14.7 % (ref 11.5–15.5)
WBC: 7.1 10*3/uL (ref 4.0–10.5)

## 2016-10-31 MED ORDER — IOPAMIDOL (ISOVUE-300) INJECTION 61%
100.0000 mL | Freq: Once | INTRAVENOUS | Status: AC | PRN
  Administered 2016-10-31: 100 mL via INTRAVENOUS

## 2016-10-31 MED ORDER — ONDANSETRON HCL 4 MG/2ML IJ SOLN
4.0000 mg | Freq: Once | INTRAMUSCULAR | Status: AC
  Administered 2016-10-31: 4 mg via INTRAVENOUS
  Filled 2016-10-31: qty 2

## 2016-10-31 MED ORDER — IOPAMIDOL (ISOVUE-300) INJECTION 61%
INTRAVENOUS | Status: AC
  Administered 2016-10-31: 30 mL
  Filled 2016-10-31: qty 30

## 2016-10-31 MED ORDER — PANTOPRAZOLE SODIUM 40 MG IV SOLR
40.0000 mg | Freq: Once | INTRAVENOUS | Status: AC
  Administered 2016-10-31: 40 mg via INTRAVENOUS
  Filled 2016-10-31: qty 40

## 2016-10-31 MED ORDER — SODIUM CHLORIDE 0.9 % IV BOLUS (SEPSIS)
1000.0000 mL | Freq: Once | INTRAVENOUS | Status: AC
  Administered 2016-10-31: 1000 mL via INTRAVENOUS

## 2016-10-31 MED ORDER — HYDROMORPHONE HCL 1 MG/ML IJ SOLN
0.5000 mg | Freq: Once | INTRAMUSCULAR | Status: AC
  Administered 2016-10-31: 0.5 mg via INTRAVENOUS
  Filled 2016-10-31: qty 1

## 2016-10-31 NOTE — Discharge Instructions (Signed)
Call Dr Rourke's office tomorrow am.  Tell them that you were seen in the emergency department today and the doctor felt like he needed to be checked next week.  Return to the emergency department this weekend if you get worse

## 2016-10-31 NOTE — ED Notes (Signed)
Patient given 2 bottles of contrast to consume for CT at 2015 hrs.

## 2016-10-31 NOTE — ED Triage Notes (Addendum)
Pt arrives via [facility] for chronic nausea and vomiting for "over a year" states she is now feeling weak.  Pt states she sees Dr Jena Gaussourk for same, takes phenergan and zofran at home for same

## 2016-10-31 NOTE — ED Provider Notes (Signed)
[facility] EMERGENCY DEPARTMENT Provider Note   CSN: [id number] Arrival date & time: 10/31/16  1725     History   Chief Complaint Chief Complaint  Patient presents with  . Emesis    HPI MAKHYA ARAVE is a 76 y.o. female.  Patient complains of nausea for over a year.  She states that for the last week or so she is felt even more nauseated and could not eat.  She is able to drink.  She has had some pain in her abdomen.   The history is provided by the patient. No language interpreter was used.  Illness  This is a recurrent problem. The current episode started more than 2 days ago. The problem occurs constantly. Pertinent negatives include no chest pain, no abdominal pain and no headaches. Nothing aggravates the symptoms. Nothing relieves the symptoms. She has tried nothing for the symptoms. The treatment provided no relief.    Past Medical History:  Diagnosis Date  . Acute pulmonary embolism (HCC) 10/19/2015  . Bipolar 1 disorder (HCC)   . COPD (chronic obstructive pulmonary disease) (HCC)   . DDD (degenerative disc disease), lumbar   . Depression   . Emphysema of lung (HCC)   . GERD (gastroesophageal reflux disease)   . High cholesterol   . Hypertension   . Sleep apnea     Patient Active Problem List   Diagnosis Date Noted  . Acute pulmonary embolism (HCC) 10/19/2015  . Chest pain on exertion 04/06/2015  . RUQ pain 03/08/2015  . Mucosal abnormality of stomach   . GERD (gastroesophageal reflux disease) 12/27/2014  . N&V (nausea and vomiting) 12/27/2014  . Weight gain 12/27/2014  . Constipation 12/27/2014  . Dyslipidemia 08/22/2009  . TOBACCO ABUSE 08/22/2009  . ESSENTIAL HYPERTENSION, BENIGN 08/22/2009  . CORONARY ATHEROSCLEROSIS NATIVE CORONARY ARTERY 08/22/2009  . CLAUDICATION 08/22/2009    Past Surgical History:  Procedure Laterality Date  . ABDOMINAL HYSTERECTOMY    . BACK SURGERY    . BIOPSY  01/23/2015   Procedure: BIOPSY;  Surgeon: Corbin Ade,  MD;  Location: [facility];  Service: Endoscopy;;  gastric biopsies  . ESOPHAGOGASTRODUODENOSCOPY (EGD) WITH PROPOFOL N/A 01/23/2015   RMR: abnormal gastric mucosa.pyloric channel of uncertain significance status post biopys  . TONSILLECTOMY      OB History    Gravida Para Term Preterm AB Living   5 5 4 1   4    SAB TAB Ectopic Multiple Live Births                   Home Medications    Prior to Admission medications   Medication Sig Start Date End Date Taking? Authorizing Provider  amLODipine (NORVASC) 5 MG tablet Take 5 mg by mouth daily.  01/17/16  Yes [provider]  clonazePAM (KLONOPIN) 0.5 MG tablet Take 0.5 mg by mouth at bedtime.  02/12/16  Yes [provider]  cloNIDine (CATAPRES) 0.1 MG tablet Take 0.1 mg by mouth 2 (two) times daily.  01/02/16  Yes [provider]  DEXILANT 60 MG capsule TAKE ONE (1) CAPSULE EACH DAY 04/11/16  Yes Gelene Mink, NP  isosorbide mononitrate (IMDUR) 30 MG 24 hr tablet Take 1 tablet (30 mg total) by mouth daily. Patient taking differently: Take 30 mg by mouth daily as needed (for chest pain).  05/30/15  Yes Croitoru, Mihai, MD  ondansetron (ZOFRAN) 8 MG tablet Take 8 mg by mouth every 8 (eight) hours as needed for nausea or  vomiting.  01/17/16  Yes [provider]  oxyCODONE (OXYCONTIN) 40 mg 12 hr tablet Take 40 mg by mouth 2 (two) times daily. 10/12/16  Yes [provider]  Oxycodone HCl 20 MG TABS Take 20 mg by mouth 2 (two) times daily. 10/08/16  Yes [provider]  promethazine (PHENERGAN) 25 MG suppository Place 25 mg rectally at bedtime as needed for nausea or vomiting.   Yes [provider]  promethazine (PHENERGAN) 25 MG tablet Take 25 mg by mouth every 6 (six) hours as needed for nausea or vomiting.   Yes [provider]  PROAIR HFA 108 (90 Base) MCG/ACT inhaler Inhale 1-2 puffs into the lungs every 6 (six) hours as needed for wheezing or shortness of breath.  09/11/16    [provider]    Family History Family History  Problem Relation Age of Onset  . Heart disease Father   . Heart attack Father   . Heart attack Brother   . Heart Problems Brother   . Heart attack Brother   . Cancer Child   . Cerebral palsy Daughter   . Colon cancer Neg Hx     Social History Social History  Substance Use Topics  . Smoking status: Current Some Day Smoker    Packs/day: 1.00    Years: 50.00    Types: Cigarettes    Start date: 01/30/1963  . Smokeless tobacco: Never Used  . Alcohol use No     Allergies   Imitrex [sumatriptan]; Ciprofloxacin; and Demerol [meperidine]   Review of Systems Review of Systems  Constitutional: Negative for appetite change and fatigue.  HENT: Negative for congestion, ear discharge and sinus pressure.   Eyes: Negative for discharge.  Respiratory: Negative for cough.   Cardiovascular: Negative for chest pain.  Gastrointestinal: Positive for nausea. Negative for abdominal pain and diarrhea.  Genitourinary: Negative for frequency and hematuria.  Musculoskeletal: Negative for back pain.  Skin: Negative for rash.  Neurological: Negative for seizures and headaches.  Psychiatric/Behavioral: Negative for hallucinations.     Physical Exam Updated Vital Signs BP (!) 154/78   Pulse 64   Temp 98.6 F (37 C) (Oral)   Resp 16   Ht 5\' 1"  (1.549 m)   Wt 47.2 kg (104 lb)   SpO2 99%   BMI 19.65 kg/m   Physical Exam  Constitutional: She is oriented to person, place, and time. She appears well-developed.  HENT:  Head: Normocephalic.  Eyes: Conjunctivae and EOM are normal. No scleral icterus.  Neck: Neck supple. No thyromegaly present.  Cardiovascular: Normal rate and regular rhythm.  Exam reveals no gallop and no friction rub.   No murmur heard. Pulmonary/Chest: No stridor. She has no wheezes. She has no rales. She exhibits no tenderness.  Abdominal: She exhibits no distension. There is no tenderness. There is no rebound.   Musculoskeletal: Normal range of motion. She exhibits no edema.  Lymphadenopathy:    She has no cervical adenopathy.  Neurological: She is oriented to person, place, and time. She exhibits normal muscle tone. Coordination normal.  Skin: No rash noted. No erythema.  Psychiatric: She has a normal mood and affect. Her behavior is normal.     ED Treatments / Results  Labs (all labs ordered are listed, but only abnormal results are displayed) Labs Reviewed  COMPREHENSIVE METABOLIC PANEL - Abnormal; Notable for the following:       Result Value   Calcium 8.8 (*)    ALT 8 (*)  All other components within normal limits  CBC WITH DIFFERENTIAL/PLATELET    EKG  EKG Interpretation None       Radiology Ct Abdomen Pelvis W Contrast  Result Date: 10/31/2016 CLINICAL DATA:  Abdominal distension nausea EXAM: CT ABDOMEN AND PELVIS WITH CONTRAST TECHNIQUE: Multidetector CT imaging of the abdomen and pelvis was performed using the standard protocol following bolus administration of intravenous contrast. CONTRAST:  ISOVUE-300 IOPAMIDOL (ISOVUE-300) INJECTION 61%, <See Chart> ISOVUE-300 IOPAMIDOL (ISOVUE-300) INJECTION 61% COMPARISON:  07/15/2016, report 07/15/2016, nuclear medicine study 08/07/2016 FINDINGS: Lower chest: Lung bases demonstrate patchy dependent atelectasis. Borderline heart size. Hepatobiliary: Re- demonstrated intra and extrahepatic biliary dilatation with extrahepatic bile duct measuring up to 1 cm. No calcified stones. No focal hepatic abnormality. Pancreas: Unremarkable. No pancreatic ductal dilatation or surrounding inflammatory changes. Spleen: Normal in size without focal abnormality. Adrenals/Urinary Tract: Adrenal glands are unremarkable. Kidneys are normal, without renal calculi, focal lesion, or hydronephrosis. Bladder is unremarkable. Stomach/Bowel: The stomach is nonenlarged. No dilated small bowel. No colon wall thickening. Scattered sigmoid colon diverticula.  Vascular/Lymphatic: No significantly enlarged lymph nodes. Moderate atherosclerotic disease of the aorta with mild fusiform ectasia but no aneurysmal dilatation. Reproductive: Status post hysterectomy. No adnexal masses. Other: Negative for free air or free fluid. Musculoskeletal: Posterior stabilization rods and fixating screws at L4 and L5 with interbody device and fusion. Similar appearance of left L4 pedicle screw parallel to the cortex of left vertebral body. IMPRESSION: 1. Re- demonstrated intra and extrahepatic biliary dilatation for which ERCP has been previously recommended. 2. There are no definite acute interval abnormality since the prior CT. Electronically Signed   By: Jasmine Pang M.D.   On: 10/31/2016 21:09    Procedures Procedures (including critical care time)  Medications Ordered in ED Medications  sodium chloride 0.9 % bolus 1,000 mL (0 mLs Intravenous Stopped 10/31/16 1959)  pantoprazole (PROTONIX) injection 40 mg (40 mg Intravenous Given 10/31/16 1819)  ondansetron (ZOFRAN) injection 4 mg (4 mg Intravenous Given 10/31/16 1819)  iopamidol (ISOVUE-300) 61 % injection (30 mLs  Contrast Given 10/31/16 2024)  HYDROmorphone (DILAUDID) injection 0.5 mg (0.5 mg Intravenous Given 10/31/16 2043)  ondansetron (ZOFRAN) injection 4 mg (4 mg Intravenous Given 10/31/16 2043)  iopamidol (ISOVUE-300) 61 % injection 100 mL (100 mLs Intravenous Contrast Given 10/31/16 2024)     Initial Impression / Assessment and Plan / ED Course  I have reviewed the triage vital signs and the nursing notes.  Pertinent labs & imaging results that were available during my care of the patient were reviewed by me and considered in my medical decision making (see chart for details).   Patient with persistent nausea that gotten worse.  CT scan shows biliary dilatation.  Radiology suggested ERCP.  Patient does not want to stay in the hospital.  She feels much better after some IV fluids.  She has been instructed  to call her GI doctor tomorrow morning and let them know she was in the emergency department.  Patient is being followed by Dr.Rourke   Final Clinical Impressions(s) / ED Diagnoses   Final diagnoses:  Acute vomiting    New Prescriptions New Prescriptions   No medications on file     Bethann Berkshire, MD 10/31/16 2159

## 2016-11-04 ENCOUNTER — Telehealth: Payer: Self-pay | Admitting: Internal Medicine

## 2016-11-04 NOTE — Telephone Encounter (Signed)
Pt called to ask me if we received her VM that she left over the weekend. I told her that Misty StanleyStacey received her VM, but she didn't leave a phone number or dob for us to look her up to contact her. Pt said she was seen in the ER over the weekend and was told to follow up with RMR this week. I told her that AB had an opening tomorrow at 2. Patient got really hostile on the phone saying that she wasn't going to see anyone but RMR and it was going to be this week. I offered her another appointment that would be with RMR on 11/6, but she got upset even more saying if something happens to her where she can't see RMR this week that it'll be hell to pay. She kept saying [facility] [place] [facility] [place] [facility] and she knows the games we're playing. I told her I wasn't playing games and offered her again to see AB tomorrow but she said no that she would wait to see RMR on 11/14. She wasn't scheduled with RMR on 11/14 it was with LSL. I called the patient back after this call ended and left a VM that RMR had a cancellation for tomorrow at 3. Her son called back and said that she would be here.

## 2016-11-05 ENCOUNTER — Ambulatory Visit (INDEPENDENT_AMBULATORY_CARE_PROVIDER_SITE_OTHER): Payer: Medicare Other | Admitting: Internal Medicine

## 2016-11-05 ENCOUNTER — Encounter: Payer: Self-pay | Admitting: Internal Medicine

## 2016-11-05 ENCOUNTER — Other Ambulatory Visit: Payer: Self-pay

## 2016-11-05 ENCOUNTER — Telehealth: Payer: Self-pay | Admitting: *Deleted

## 2016-11-05 VITALS — BP 141/90 | HR 114 | Temp 98.2°F | Ht 61.0 in | Wt 103.4 lb

## 2016-11-05 DIAGNOSIS — K5903 Drug induced constipation: Secondary | ICD-10-CM | POA: Diagnosis not present

## 2016-11-05 DIAGNOSIS — K811 Chronic cholecystitis: Secondary | ICD-10-CM

## 2016-11-05 DIAGNOSIS — K219 Gastro-esophageal reflux disease without esophagitis: Secondary | ICD-10-CM

## 2016-11-05 DIAGNOSIS — T402X5A Adverse effect of other opioids, initial encounter: Secondary | ICD-10-CM

## 2016-11-05 NOTE — Progress Notes (Signed)
Primary Care Physician:  Samuel JesterButler, Cynthia, DO Primary Gastroenterologist:  Dr. Jena Gaussourk  Pre-Procedure History & Physical: HPI:  Christine Rogers is a 76 y.o. female here for follow-up of right upper quadrant abdominal pain, constipation and nausea.  Right upper quadrant pain has improved, but she's been afraid to eat much; she drinks fluids. Debilitating back pain on narcotics chronically. Reflux symptoms well controlled. Recent ED visit revealed unremarkable labs;   CT scan, again, revealed a chronically dilated biliary tree (LFTs normal), Normal-appearing pancreas on recent CT as well; gallbladder okay on CT.  However,  HIDA demonstrated chronic cholecystitis ( nonvisualization with morphine augmentation). Markedly abnormal study compared to prior study done a year or so ago. She has lost another pound and a half. Currently weighs 103.4 pounds. Phenergan suppositories for nausea. EGD last year for similar symptoms revealed non-H. Pylori gastritis but no other significant abnormalities.  Linzess as when necessary constipation.  Past Medical History:  Diagnosis Date  . Acute pulmonary embolism (HCC) 10/19/2015  . Bipolar 1 disorder (HCC)   . COPD (chronic obstructive pulmonary disease) (HCC)   . DDD (degenerative disc disease), lumbar   . Depression   . Emphysema of lung (HCC)   . GERD (gastroesophageal reflux disease)   . High cholesterol   . Hypertension   . Sleep apnea     Past Surgical History:  Procedure Laterality Date  . ABDOMINAL HYSTERECTOMY    . BACK SURGERY    . BIOPSY  01/23/2015   Procedure: BIOPSY;  Surgeon: Corbin Adeobert M Sharanya Templin, MD;  Location: [facility];  Service: Endoscopy;;  gastric biopsies  . ESOPHAGOGASTRODUODENOSCOPY (EGD) WITH PROPOFOL N/A 01/23/2015   RMR: abnormal gastric mucosa.pyloric channel of uncertain significance status post biopys  . TONSILLECTOMY      Prior to Admission medications   Medication Sig Start Date End Date Taking? Authorizing Provider    amLODipine (NORVASC) 5 MG tablet Take 5 mg by mouth daily.  01/17/16  Yes [provider]  clonazePAM (KLONOPIN) 0.5 MG tablet Take 0.5 mg by mouth at bedtime.  02/12/16  Yes [provider]  cloNIDine (CATAPRES) 0.1 MG tablet Take 0.1 mg by mouth 2 (two) times daily.  01/02/16  Yes [provider]  DEXILANT 60 MG capsule TAKE ONE (1) CAPSULE EACH DAY 04/11/16  Yes Gelene MinkBoone, Anna W, NP  isosorbide mononitrate (IMDUR) 30 MG 24 hr tablet Take 1 tablet (30 mg total) by mouth daily. Patient taking differently: Take 30 mg by mouth daily as needed (for chest pain).  05/30/15  Yes Croitoru, Mihai, MD  ondansetron (ZOFRAN) 8 MG tablet Take 8 mg by mouth every 8 (eight) hours as needed for nausea or vomiting.  01/17/16  Yes [provider]  PROAIR HFA 108 (90 Base) MCG/ACT inhaler Inhale 1-2 puffs into the lungs every 6 (six) hours as needed for wheezing or shortness of breath.  09/11/16  Yes [provider]  promethazine (PHENERGAN) 25 MG suppository Place 25 mg rectally at bedtime as needed for nausea or vomiting.   Yes [provider]  promethazine (PHENERGAN) 25 MG tablet Take 25 mg by mouth every 6 (six) hours as needed for nausea or vomiting.   Yes [provider]  oxyCODONE (OXYCONTIN) 40 mg 12 hr tablet Take 40 mg by mouth 2 (two) times daily. 10/12/16   [provider]  Oxycodone HCl 20 MG TABS Take 20 mg by mouth 2 (two) times daily. 10/08/16   [provider]  Allergies as of 11/05/2016 - Review Complete 11/05/2016  Allergen Reaction Noted  . Imitrex [sumatriptan] Anaphylaxis and Swelling 04/10/2014  . Ciprofloxacin    . Fentanyl  11/05/2016  . Demerol [meperidine] Nausea And Vomiting 05/30/2015    Family History  Problem Relation Age of Onset  . Heart disease Father   . Heart attack Father   . Heart attack Brother   . Heart Problems Brother   . Heart attack Brother   . Cancer Child   . Cerebral palsy Daughter   .  Colon cancer Neg Hx     Social History   Social History  . Marital status: Divorced    Spouse name: N/A  . Number of children: N/A  . Years of education: N/A   Occupational History  . Not on file.   Social History Main Topics  . Smoking status: Current Some Day Smoker    Packs/day: 1.00    Years: 50.00    Types: Cigarettes    Start date: 01/30/1963  . Smokeless tobacco: Never Used  . Alcohol use No  . Drug use: No  . Sexual activity: Not Currently    Birth control/ protection: Surgical   Other Topics Concern  . Not on file   Social History Narrative  . No narrative on file    Review of Systems: See HPI, otherwise negative ROS  Physical Exam: BP (!) 141/90   Pulse (!) 114   Temp 98.2 F (36.8 C) (Oral)   Ht 5\' 1"  (1.549 m)   Wt 103 lb 6.4 oz (46.9 kg)   BMI 19.54 kg/m  General:   Alert, feeble, chronically ill appearing lady ambulating with a cane. Has a back splint in place. Accompanied by son and daughter and granddaughter. Somewhat hard of hearing. Neck:  Supple; no masses or thyromegaly. No significant cervical adenopathy. Lungs:  Clear throughout to auscultation.   No wheezes, crackles, or rhonchi. No acute distress. Heart:  Regular rate and rhythm; no murmurs, clicks, rubs,  or gallops. Abdomen: Non-distended, normal bowel sounds.  Soft and nontender without appreciable mass or hepatosplenomegaly.  Pulses:  Normal pulses noted. Extremities:  Without clubbing or edema.  Impression:   77 year old lady with multiple medical problems with somewhat chronic right upper quadrant abdominal pain and nausea . She's been fairly extensively evaluated. Recent HIDA implies chronic cholecystitis. Normal-appearing gallbladder on cross-sectional imaging otherwise. She has less abdominal pain these days but is afraid to eat. She has ongoing nausea.  I doubt we are dealing with an ischemic process. Chronic back pain and opioid-induced constipation also contributing complicating  factors. I suspect she likely has underlying gallbladder disease and would benefit from seeing a surgeon for consideration of cholecystectomy. I've explained to the patient and her family that even if her gallbladder is removed that no one could issue or 100% guarantee that all of her symptoms would resolve with this maneuver. She is agreeable to seeing a Careers adviser.   Recommendations: Referral to Dr. Franky Macho - tomorrow -  for consultation regarding cholecystectomy for chronic cholecystitis   Continue Dexilatn for Reflux  Linzess prn constipation  Continue Phenergan suppositories as needed for nausea  I await to hear what Dr. Lovell Sheehan recommends     Notice: This dictation was prepared with Dragon dictation along with smaller phrase technology. Any transcriptional errors that result from this process are unintentional and may not be corrected upon review.

## 2016-11-05 NOTE — Patient Instructions (Addendum)
Referral to Dr. Franky MachoMark Jenkins - tomorrow -  for consultation regarding cholecystectomy for chronic cholecystitis   Continue Dexilatn for Reflux  Continue Phenergan suppositories as needed for nausea  I await to hear what Dr. Lovell SheehanJenkins recommends

## 2016-11-05 NOTE — Telephone Encounter (Signed)
Tresa EndoKelly called from Dr. York RamJenkin's office. They are in surgery tomorrow but they can see patient on 11/07/16 arrival time is 9:30am for 9:45am appt. Patient was given Dr. York RamJenkin's office phone # when they were here in the office today to f/u on appt as they stated they have no working phones at the moment. All #'s in chart is not valid.

## 2016-11-07 ENCOUNTER — Encounter: Payer: Self-pay | Admitting: General Surgery

## 2016-11-07 ENCOUNTER — Ambulatory Visit (INDEPENDENT_AMBULATORY_CARE_PROVIDER_SITE_OTHER): Payer: Medicare Other | Admitting: General Surgery

## 2016-11-07 VITALS — BP 148/94 | HR 109 | Temp 98.7°F | Resp 18 | Ht 61.0 in | Wt 105.0 lb

## 2016-11-07 DIAGNOSIS — K811 Chronic cholecystitis: Secondary | ICD-10-CM | POA: Diagnosis not present

## 2016-11-07 NOTE — Progress Notes (Signed)
Christine Rogers; [id number]; 05/17/1940   HPI Patient is a 76-year-old white female who was referred to my care by Dr. Butler and Rourk for evaluation and treatment of nonspecific abdominal pain.  She states that she has had abdominal pain for over 1-1/2 years.  She has a decreased appetite.  She does complain of bloating.  She has had her gallbladder looked at several times.  Ultrasound has been negative for cholelithiasis.  She recently had a HIDA scan which revealed nonfilling of the gallbladder.  She denies any fever or chills.  She denies any jaundice.  She currently has no pain.  She states she has not eaten well for the past 3 weeks and feels weak.  She denies any diarrhea or constipation.  She does not have fatty food intolerance. Past Medical History:  Diagnosis Date  . Acute pulmonary embolism (HCC) 10/19/2015  . Bipolar 1 disorder (HCC)   . COPD (chronic obstructive pulmonary disease) (HCC)   . DDD (degenerative disc disease), lumbar   . Depression   . Emphysema of lung (HCC)   . GERD (gastroesophageal reflux disease)   . High cholesterol   . Hypertension   . Sleep apnea     Past Surgical History:  Procedure Laterality Date  . ABDOMINAL HYSTERECTOMY    . BACK SURGERY    . BIOPSY  01/23/2015   Procedure: BIOPSY;  Surgeon: Robert M Rourk, MD;  Location: [facility];  Service: Endoscopy;;  gastric biopsies  . ESOPHAGOGASTRODUODENOSCOPY (EGD) WITH PROPOFOL N/A 01/23/2015   RMR: abnormal gastric mucosa.pyloric channel of uncertain significance status post biopys  . TONSILLECTOMY      Family History  Problem Relation Age of Onset  . Heart disease Father   . Heart attack Father   . Heart attack Brother   . Heart Problems Brother   . Heart attack Brother   . Cancer Child   . Cerebral palsy Daughter   . Colon cancer Neg Hx     Current Outpatient Prescriptions on File Prior to Visit  Medication Sig Dispense Refill  . amLODipine (NORVASC) 5 MG tablet Take 5 mg by mouth  daily.     . clonazePAM (KLONOPIN) 0.5 MG tablet Take 0.5 mg by mouth at bedtime.     . cloNIDine (CATAPRES) 0.1 MG tablet Take 0.1 mg by mouth 2 (two) times daily.     . DEXILANT 60 MG capsule TAKE ONE (1) CAPSULE EACH DAY 90 capsule 3  . isosorbide mononitrate (IMDUR) 30 MG 24 hr tablet Take 1 tablet (30 mg total) by mouth daily. (Patient taking differently: Take 30 mg by mouth daily as needed (for chest pain). ) 30 tablet 11  . ondansetron (ZOFRAN) 8 MG tablet Take 8 mg by mouth every 8 (eight) hours as needed for nausea or vomiting.     . oxyCODONE (OXYCONTIN) 40 mg 12 hr tablet Take 40 mg by mouth 2 (two) times daily.    . Oxycodone HCl 20 MG TABS Take 20 mg by mouth 2 (two) times daily.    . PROAIR HFA 108 (90 Base) MCG/ACT inhaler Inhale 1-2 puffs into the lungs every 6 (six) hours as needed for wheezing or shortness of breath.     . promethazine (PHENERGAN) 25 MG suppository Place 25 mg rectally at bedtime as needed for nausea or vomiting.    . promethazine (PHENERGAN) 25 MG tablet Take 25 mg by mouth every 6 (six) hours as needed for nausea or vomiting.       No current facility-administered medications on file prior to visit.     Allergies  Allergen Reactions  . Imitrex [Sumatriptan] Anaphylaxis and Swelling  . Ciprofloxacin     UNKNOWN REACTION  . Fentanyl     "Unable to function at all"  . Demerol [Meperidine] Nausea And Vomiting    History  Alcohol Use No    History  Smoking Status  . Current Some Day Smoker  . Packs/day: 1.00  . Years: 50.00  . Types: Cigarettes  . Start date: 01/30/1963  Smokeless Tobacco  . Never Used    Review of Systems  Constitutional: Positive for malaise/fatigue.  HENT: Negative.   Eyes: Negative.   Respiratory: Positive for cough and shortness of breath.   Cardiovascular: Negative.   Gastrointestinal: Positive for nausea. Negative for heartburn.  Genitourinary: Negative.   Musculoskeletal: Positive for back pain.  Skin: Negative.    Neurological: Negative.   Endo/Heme/Allergies:       Clotting difficulties  Psychiatric/Behavioral: Negative.     Objective   Vitals:   11/07/16 1004  BP: (!) 148/94  Pulse: (!) 109  Resp: 18  Temp: 98.7 F (37.1 C)    Physical Exam  Constitutional: She is oriented to person, place, and time.  Frail-appearing white female who ambulates with a cane and is wearing a back brace.  HENT:  Head: Normocephalic and atraumatic.  Cardiovascular: Normal rate, regular rhythm and normal heart sounds.  Exam reveals no gallop and no friction rub.   No murmur heard. Pulmonary/Chest: Effort normal. No respiratory distress. She has wheezes. She has no rales.  Abdominal: Soft. Bowel sounds are normal. She exhibits no distension. There is no tenderness. There is no rebound.  Neurological: She is alert and oriented to person, place, and time.  Skin: Skin is warm.  Vitals reviewed.  Dr. Luvenia Starchourk's notes reviewed. HIDA scan report reviewed. Assessment   Abdominal discomfort, decreased appetite, abnormal HIDA scan Plan   I told patient that I could do a cholecystectomy, but I could not give her 100% guarantee that this would resolve all her symptoms.  She does not want surgery at this time as she feels too weak.  I told her to call me should she change her mind.  The risks and benefits of surgery including bleeding, infection, hepatobiliary injury, and the possibility of recurrence of her symptoms were fully explained to the patient, who gave informed consent.

## 2016-11-07 NOTE — Progress Notes (Signed)
Communication noted.  I appreciate  Dr. Lovell SheehanJenkins seeing patient.

## 2016-11-07 NOTE — Patient Instructions (Signed)

## 2016-11-20 ENCOUNTER — Ambulatory Visit: Payer: Medicare Other | Admitting: Gastroenterology

## 2016-12-19 NOTE — H&P (Signed)
Christine Rogers; [id number]; 10-16-1940   HPI Patient is a 76 year old white female who was referred to my care by Dr. Charm BargesButler and Rourk for evaluation and treatment of nonspecific abdominal pain.  She states that she has had abdominal pain for over 1-1/2 years.  She has a decreased appetite.  She does complain of bloating.  She has had her gallbladder looked at several times.  Ultrasound has been negative for cholelithiasis.  She recently had a HIDA scan which revealed nonfilling of the gallbladder.  She denies any fever or chills.  She denies any jaundice.  She currently has no pain.  She states she has not eaten well for the past 3 weeks and feels weak.  She denies any diarrhea or constipation.  She does not have fatty food intolerance. Past Medical History:  Diagnosis Date  . Acute pulmonary embolism (HCC) 10/19/2015  . Bipolar 1 disorder (HCC)   . COPD (chronic obstructive pulmonary disease) (HCC)   . DDD (degenerative disc disease), lumbar   . Depression   . Emphysema of lung (HCC)   . GERD (gastroesophageal reflux disease)   . High cholesterol   . Hypertension   . Sleep apnea     Past Surgical History:  Procedure Laterality Date  . ABDOMINAL HYSTERECTOMY    . BACK SURGERY    . BIOPSY  01/23/2015   Procedure: BIOPSY;  Surgeon: Corbin Adeobert M Rourk, MD;  Location: [facility];  Service: Endoscopy;;  gastric biopsies  . ESOPHAGOGASTRODUODENOSCOPY (EGD) WITH PROPOFOL N/A 01/23/2015   RMR: abnormal gastric mucosa.pyloric channel of uncertain significance status post biopys  . TONSILLECTOMY      Family History  Problem Relation Age of Onset  . Heart disease Father   . Heart attack Father   . Heart attack Brother   . Heart Problems Brother   . Heart attack Brother   . Cancer Child   . Cerebral palsy Daughter   . Colon cancer Neg Hx     Current Outpatient Prescriptions on File Prior to Visit  Medication Sig Dispense Refill  . amLODipine (NORVASC) 5 MG tablet Take 5 mg by mouth  daily.     . clonazePAM (KLONOPIN) 0.5 MG tablet Take 0.5 mg by mouth at bedtime.     . cloNIDine (CATAPRES) 0.1 MG tablet Take 0.1 mg by mouth 2 (two) times daily.     Marland Kitchen. DEXILANT 60 MG capsule TAKE ONE (1) CAPSULE EACH DAY 90 capsule 3  . isosorbide mononitrate (IMDUR) 30 MG 24 hr tablet Take 1 tablet (30 mg total) by mouth daily. (Patient taking differently: Take 30 mg by mouth daily as needed (for chest pain). ) 30 tablet 11  . ondansetron (ZOFRAN) 8 MG tablet Take 8 mg by mouth every 8 (eight) hours as needed for nausea or vomiting.     Marland Kitchen. oxyCODONE (OXYCONTIN) 40 mg 12 hr tablet Take 40 mg by mouth 2 (two) times daily.    . Oxycodone HCl 20 MG TABS Take 20 mg by mouth 2 (two) times daily.    Marland Kitchen. PROAIR HFA 108 (90 Base) MCG/ACT inhaler Inhale 1-2 puffs into the lungs every 6 (six) hours as needed for wheezing or shortness of breath.     . promethazine (PHENERGAN) 25 MG suppository Place 25 mg rectally at bedtime as needed for nausea or vomiting.    . promethazine (PHENERGAN) 25 MG tablet Take 25 mg by mouth every 6 (six) hours as needed for nausea or vomiting.  No current facility-administered medications on file prior to visit.     Allergies  Allergen Reactions  . Imitrex [Sumatriptan] Anaphylaxis and Swelling  . Ciprofloxacin     UNKNOWN REACTION  . Fentanyl     "Unable to function at all"  . Demerol [Meperidine] Nausea And Vomiting    History  Alcohol Use No    History  Smoking Status  . Current Some Day Smoker  . Packs/day: 1.00  . Years: 50.00  . Types: Cigarettes  . Start date: 01/30/1963  Smokeless Tobacco  . Never Used    Review of Systems  Constitutional: Positive for malaise/fatigue.  HENT: Negative.   Eyes: Negative.   Respiratory: Positive for cough and shortness of breath.   Cardiovascular: Negative.   Gastrointestinal: Positive for nausea. Negative for heartburn.  Genitourinary: Negative.   Musculoskeletal: Positive for back pain.  Skin: Negative.    Neurological: Negative.   Endo/Heme/Allergies:       Clotting difficulties  Psychiatric/Behavioral: Negative.     Objective   Vitals:   11/07/16 1004  BP: (!) 148/94  Pulse: (!) 109  Resp: 18  Temp: 98.7 F (37.1 C)    Physical Exam  Constitutional: She is oriented to person, place, and time.  Frail-appearing white female who ambulates with a cane and is wearing a back brace.  HENT:  Head: Normocephalic and atraumatic.  Cardiovascular: Normal rate, regular rhythm and normal heart sounds.  Exam reveals no gallop and no friction rub.   No murmur heard. Pulmonary/Chest: Effort normal. No respiratory distress. She has wheezes. She has no rales.  Abdominal: Soft. Bowel sounds are normal. She exhibits no distension. There is no tenderness. There is no rebound.  Neurological: She is alert and oriented to person, place, and time.  Skin: Skin is warm.  Vitals reviewed.  Dr. Luvenia Starchourk's notes reviewed. HIDA scan report reviewed. Assessment   Abdominal discomfort, decreased appetite, abnormal HIDA scan Plan   I told patient that I could do a cholecystectomy, but I could not give her 100% guarantee that this would resolve all her symptoms.  She does not want surgery at this time as she feels too weak.  I told her to call me should she change her mind.  The risks and benefits of surgery including bleeding, infection, hepatobiliary injury, and the possibility of recurrence of her symptoms were fully explained to the patient, who gave informed consent.

## 2016-12-19 NOTE — Patient Instructions (Signed)
Christine DunkBarbara H Rogers  12/19/2016     @PREFPERIOPPHARMACY @   Your procedure is scheduled on  12/25/2016   [facility]  [facility].  Call this number if you have problems the morning of surgery:  [phone]   Remember:  Do not eat food or drink liquids after midnight.  Take these medicines the morning of surgery with A SIP OF WATER  Norvasc, catpres, dexilant, zofran, oxycodone, phenergan. Use your inhaler before you come and bring it with you.   Do not wear jewelry, make-up or nail polish.  Do not wear lotions, powders, or perfumes, or deodorant.  Do not shave 48 hours prior to surgery.  Men may shave face and neck.  Do not bring valuables to the hospital.  [facility] belongings or valuables.  Contacts, dentures or bridgework may not be worn into surgery.  Leave your suitcase in the car.  After surgery it may be brought to your room.  For patients admitted to the hospital, discharge time will be determined by your treatment team.  Patients discharged the day of surgery will not be allowed to drive home.   Name and phone number of your driver:   family Special instructions: None  Please read over the following fact sheets that you were given. Anesthesia Post-op Instructions and Care and Recovery After Surgery       Laparoscopic Cholecystectomy Laparoscopic cholecystectomy is surgery to remove the gallbladder. The gallbladder is a pear-shaped organ that lies beneath the liver on the right side of the body. The gallbladder stores bile, which is a fluid that helps the body to digest fats. Cholecystectomy is often done for inflammation of the gallbladder (cholecystitis). This condition is usually caused by a buildup of gallstones (cholelithiasis) in the gallbladder. Gallstones can block the flow of bile, which can result in inflammation and pain. In severe cases, emergency surgery may be required. This procedure is done though  small incisions in your abdomen (laparoscopic surgery). A thin scope with a camera (laparoscope) is inserted through one incision. Thin surgical instruments are inserted through the other incisions. In some cases, a laparoscopic procedure may be turned into a type of surgery that is done through a larger incision (open surgery). Tell a health care provider about:  Any allergies you have.  All medicines you are taking, including vitamins, herbs, eye drops, creams, and over-the-counter medicines.  Any problems you or family members have had with anesthetic medicines.  Any blood disorders you have.  Any surgeries you have had.  Any medical conditions you have.  Whether you are pregnant or may be pregnant. What are the risks? Generally, this is a safe procedure. However, problems may occur, including:  Infection.  Bleeding.  Allergic reactions to medicines.  Damage to other structures or organs.  A stone remaining in the common bile duct. The common bile duct carries bile from the gallbladder into the small intestine.  A bile leak from the cyst duct that is clipped when your gallbladder is removed.  What happens before the procedure? Staying hydrated Follow instructions from your health care provider about hydration, which may include:  Up to 2 hours before the procedure - you may continue to drink clear liquids, such as water, clear fruit juice, black coffee, and plain tea.  Eating and drinking restrictions Follow instructions from your health care provider about eating and drinking, which may include:  8 hours before the procedure - stop eating heavy meals or foods such as meat, fried foods, or fatty foods.  6 hours before the procedure - stop eating light meals or foods, such as toast or cereal.  6 hours before the procedure - stop drinking milk or drinks that contain milk.  2 hours before the procedure - stop drinking clear liquids.  Medicines  Ask your health care  provider about: ? Changing or stopping your regular medicines. This is especially important if you are taking diabetes medicines or blood thinners. ? Taking medicines such as aspirin and ibuprofen. These medicines can thin your blood. Do not take these medicines before your procedure if your health care provider instructs you not to.  You may be given antibiotic medicine to help prevent infection. General instructions  Let your health care provider know if you develop a cold or an infection before surgery.  Plan to have someone take you home from the hospital or clinic.  Ask your health care provider how your surgical site will be marked or identified. What happens during the procedure?  To reduce your risk of infection: ? Your health care team will wash or sanitize their hands. ? Your skin will be washed with soap. ? Hair may be removed from the surgical area.  An IV tube may be inserted into one of your veins.  You will be given one or more of the following: ? A medicine to help you relax (sedative). ? A medicine to make you fall asleep (general anesthetic).  A breathing tube will be placed in your mouth.  Your surgeon will make several small cuts (incisions) in your abdomen.  The laparoscope will be inserted through one of the small incisions. The camera on the laparoscope will send images to a TV screen (monitor) in the operating room. This lets your surgeon see inside your abdomen.  Air-like gas will be pumped into your abdomen. This will expand your abdomen to give the surgeon more room to perform the surgery.  Other tools that are needed for the procedure will be inserted through the other incisions. The gallbladder will be removed through one of the incisions.  Your common bile duct may be examined. If stones are found in the common bile duct, they may be removed.  After your gallbladder has been removed, the incisions will be closed with stitches (sutures), staples, or  skin glue.  Your incisions may be covered with a bandage (dressing). The procedure may vary among health care providers and hospitals. What happens after the procedure?  Your blood pressure, heart rate, breathing rate, and blood oxygen level will be monitored until the medicines you were given have worn off.  You will be given medicines as needed to control your pain.  Do not drive for 24 hours if you were given a sedative. This information is not intended to replace advice given to you by your health care provider. Make sure you discuss any questions you have with your health care provider. Document Released: 12/24/2004 Document Revised: 07/16/2015 Document Reviewed: 06/12/2015 Elsevier Interactive Patient Education  2018 [employer].  Laparoscopic Cholecystectomy, Care After This sheet gives you information about how to care for yourself after your procedure. Your health care provider may also give you more specific instructions. If you have problems or questions, contact your health care provider. What can I expect after the procedure? After the procedure, it is common to have:  Pain at your incision sites. You will be given  medicines to control this pain.  Mild nausea or vomiting.  Bloating and possible shoulder pain from the air-like gas that was used during the procedure.  Follow these instructions at home: Incision care   Follow instructions from your health care provider about how to take care of your incisions. Make sure you: ? Wash your hands with soap and water before you change your bandage (dressing). If soap and water are not available, use hand sanitizer. ? Change your dressing as told by your health care provider. ? Leave stitches (sutures), skin glue, or adhesive strips in place. These skin closures may need to be in place for 2 weeks or longer. If adhesive strip edges start to loosen and curl up, you may trim the loose edges. Do not remove adhesive strips  completely unless your health care provider tells you to do that.  Do not take baths, swim, or use a hot tub until your health care provider approves. Ask your health care provider if you can take showers. You may only be allowed to take sponge baths for bathing.  Check your incision area every day for signs of infection. Check for: ? More redness, swelling, or pain. ? More fluid or blood. ? Warmth. ? Pus or a bad smell. Activity  Do not drive or use heavy machinery while taking prescription pain medicine.  Do not lift anything that is heavier than 10 lb (4.5 kg) until your health care provider approves.  Do not play contact sports until your health care provider approves.  Do not drive for 24 hours if you were given a medicine to help you relax (sedative).  Rest as needed. Do not return to work or school until your health care provider approves. General instructions  Take over-the-counter and prescription medicines only as told by your health care provider.  To prevent or treat constipation while you are taking prescription pain medicine, your health care provider may recommend that you: ? Drink enough fluid to keep your urine clear or pale yellow. ? Take over-the-counter or prescription medicines. ? Eat foods that are high in fiber, such as fresh fruits and vegetables, whole grains, and beans. ? Limit foods that are high in fat and processed sugars, such as fried and sweet foods. Contact a health care provider if:  You develop a rash.  You have more redness, swelling, or pain around your incisions.  You have more fluid or blood coming from your incisions.  Your incisions feel warm to the touch.  You have pus or a bad smell coming from your incisions.  You have a fever.  One or more of your incisions breaks open. Get help right away if:  You have trouble breathing.  You have chest pain.  You have increasing pain in your shoulders.  You faint or feel dizzy when you  stand.  You have severe pain in your abdomen.  You have nausea or vomiting that lasts for more than one day.  You have leg pain. This information is not intended to replace advice given to you by your health care provider. Make sure you discuss any questions you have with your health care provider. Document Released: 12/24/2004 Document Revised: 07/15/2015 Document Reviewed: 06/12/2015 Elsevier Interactive Patient Education  2017 [place] Anesthesia, Adult General anesthesia is the use of medicines to make a person "go to sleep" (be unconscious) for a medical procedure. General anesthesia is often recommended when a procedure:  Is long.  Requires you to be still  or in an unusual position.  Is major and can cause you to lose blood.  Is impossible to do without general anesthesia.  The medicines used for general anesthesia are called general anesthetics. In addition to making you sleep, the medicines:  Prevent pain.  Control your blood pressure.  Relax your muscles.  Tell a health care provider about:  Any allergies you have.  All medicines you are taking, including vitamins, herbs, eye drops, creams, and over-the-counter medicines.  Any problems you or family members have had with anesthetic medicines.  Types of anesthetics you have had in the past.  Any bleeding disorders you have.  Any surgeries you have had.  Any medical conditions you have.  Any history of heart or lung conditions, such as heart failure, sleep apnea, or chronic obstructive pulmonary disease (COPD).  Whether you are pregnant or may be pregnant.  Whether you use tobacco, alcohol, marijuana, or street drugs.  Any history of Armed forces logistics/support/administrative officer.  Any history of depression or anxiety. What are the risks? Generally, this is a safe procedure. However, problems may occur, including:  Allergic reaction to anesthetics.  Lung and heart problems.  Inhaling food or liquids from your  stomach into your lungs (aspiration).  Injury to nerves.  Waking up during your procedure and being unable to move (rare).  Extreme agitation or a state of mental confusion (delirium) when you wake up from the anesthetic.  Air in the bloodstream, which can lead to stroke.  These problems are more likely to develop if you are having a major surgery or if you have an advanced medical condition. You can prevent some of these complications by answering all of your health care provider's questions thoroughly and by following all pre-procedure instructions. General anesthesia can cause side effects, including:  Nausea or vomiting  A sore throat from the breathing tube.  Feeling cold or shivery.  Feeling tired, washed out, or achy.  Sleepiness or drowsiness.  Confusion or agitation.  What happens before the procedure? Staying hydrated Follow instructions from your health care provider about hydration, which may include:  Up to 2 hours before the procedure - you may continue to drink clear liquids, such as water, clear fruit juice, black coffee, and plain tea.  Eating and drinking restrictions Follow instructions from your health care provider about eating and drinking, which may include:  8 hours before the procedure - stop eating heavy meals or foods such as meat, fried foods, or fatty foods.  6 hours before the procedure - stop eating light meals or foods, such as toast or cereal.  6 hours before the procedure - stop drinking milk or drinks that contain milk.  2 hours before the procedure - stop drinking clear liquids.  Medicines  Ask your health care provider about: ? Changing or stopping your regular medicines. This is especially important if you are taking diabetes medicines or blood thinners. ? Taking medicines such as aspirin and ibuprofen. These medicines can thin your blood. Do not take these medicines before your procedure if your health care provider instructs you  not to. ? Taking new dietary supplements or medicines. Do not take these during the week before your procedure unless your health care provider approves them.  If you are told to take a medicine or to continue taking a medicine on the day of the procedure, take the medicine with sips of water. General instructions   Ask if you will be going home the same day, the following day,  or after a longer hospital stay. ? Plan to have someone take you home. ? Plan to have someone stay with you for the first 24 hours after you leave the hospital or clinic.  For 3-6 weeks before the procedure, try not to use any tobacco products, such as cigarettes, chewing tobacco, and e-cigarettes.  You may brush your teeth on the morning of the procedure, but make sure to spit out the toothpaste. What happens during the procedure?  You will be given anesthetics through a mask and through an IV tube in one of your veins.  You may receive medicine to help you relax (sedative).  As soon as you are asleep, a breathing tube may be used to help you breathe.  An anesthesia specialist will stay with you throughout the procedure. He or she will help keep you comfortable and safe by continuing to give you medicines and adjusting the amount of medicine that you get. He or she will also watch your blood pressure, pulse, and oxygen levels to make sure that the anesthetics do not cause any problems.  If a breathing tube was used to help you breathe, it will be removed before you wake up. The procedure may vary among health care providers and hospitals. What happens after the procedure?  You will wake up, often slowly, after the procedure is complete, usually in a recovery area.  Your blood pressure, heart rate, breathing rate, and blood oxygen level will be monitored until the medicines you were given have worn off.  You may be given medicine to help you calm down if you feel anxious or agitated.  If you will be going  home the same day, your health care provider may check to make sure you can stand, drink, and urinate.  Your health care providers will treat your pain and side effects before you go home.  Do not drive for 24 hours if you received a sedative.  You may: ? Feel nauseous and vomit. ? Have a sore throat. ? Have mental slowness. ? Feel cold or shivery. ? Feel sleepy. ? Feel tired. ? Feel sore or achy, even in parts of your body where you did not have surgery. This information is not intended to replace advice given to you by your health care provider. Make sure you discuss any questions you have with your health care provider. Document Released: 04/02/2007 Document Revised: 06/06/2015 Document Reviewed: 12/08/2014 Elsevier Interactive Patient Education  2018 [place] Anesthesia, Adult, Care After These instructions provide you with information about caring for yourself after your procedure. Your health care provider may also give you more specific instructions. Your treatment has been planned according to current medical practices, but problems sometimes occur. Call your health care provider if you have any problems or questions after your procedure. What can I expect after the procedure? After the procedure, it is common to have:  Vomiting.  A sore throat.  Mental slowness.  It is common to feel:  Nauseous.  Cold or shivery.  Sleepy.  Tired.  Sore or achy, even in parts of your body where you did not have surgery.  Follow these instructions at home: For at least 24 hours after the procedure:  Do not: ? Participate in activities where you could fall or become injured. ? Drive. ? Use heavy machinery. ? Drink alcohol. ? Take sleeping pills or medicines that cause drowsiness. ? Make important decisions or sign legal documents. ? Take care of children on your own.  Rest. Eating and drinking  If you vomit, drink water, juice, or soup when you can drink  without vomiting.  Drink enough fluid to keep your urine clear or pale yellow.  Make sure you have little or no nausea before eating solid foods.  Follow the diet recommended by your health care provider. General instructions  Have a responsible adult stay with you until you are awake and alert.  Return to your normal activities as told by your health care provider. Ask your health care provider what activities are safe for you.  Take over-the-counter and prescription medicines only as told by your health care provider.  If you smoke, do not smoke without supervision.  Keep all follow-up visits as told by your health care provider. This is important. Contact a health care provider if:  You continue to have nausea or vomiting at home, and medicines are not helpful.  You cannot drink fluids or start eating again.  You cannot urinate after 8-12 hours.  You develop a skin rash.  You have fever.  You have increasing redness at the site of your procedure. Get help right away if:  You have difficulty breathing.  You have chest pain.  You have unexpected bleeding.  You feel that you are having a life-threatening or urgent problem. This information is not intended to replace advice given to you by your health care provider. Make sure you discuss any questions you have with your health care provider. Document Released: 04/01/2000 Document Revised: 05/29/2015 Document Reviewed: 12/08/2014 Elsevier Interactive Patient Education  [employer].

## 2016-12-20 IMAGING — DX DG CHEST 2V
2 series · 2 of 2 positions shown · non-contrast
Comparison: 04/10/2014

CLINICAL DATA: Lower chest pain and nausea for past 3 days, smoker

EXAM:
CHEST  2 VIEW

[chest pa]
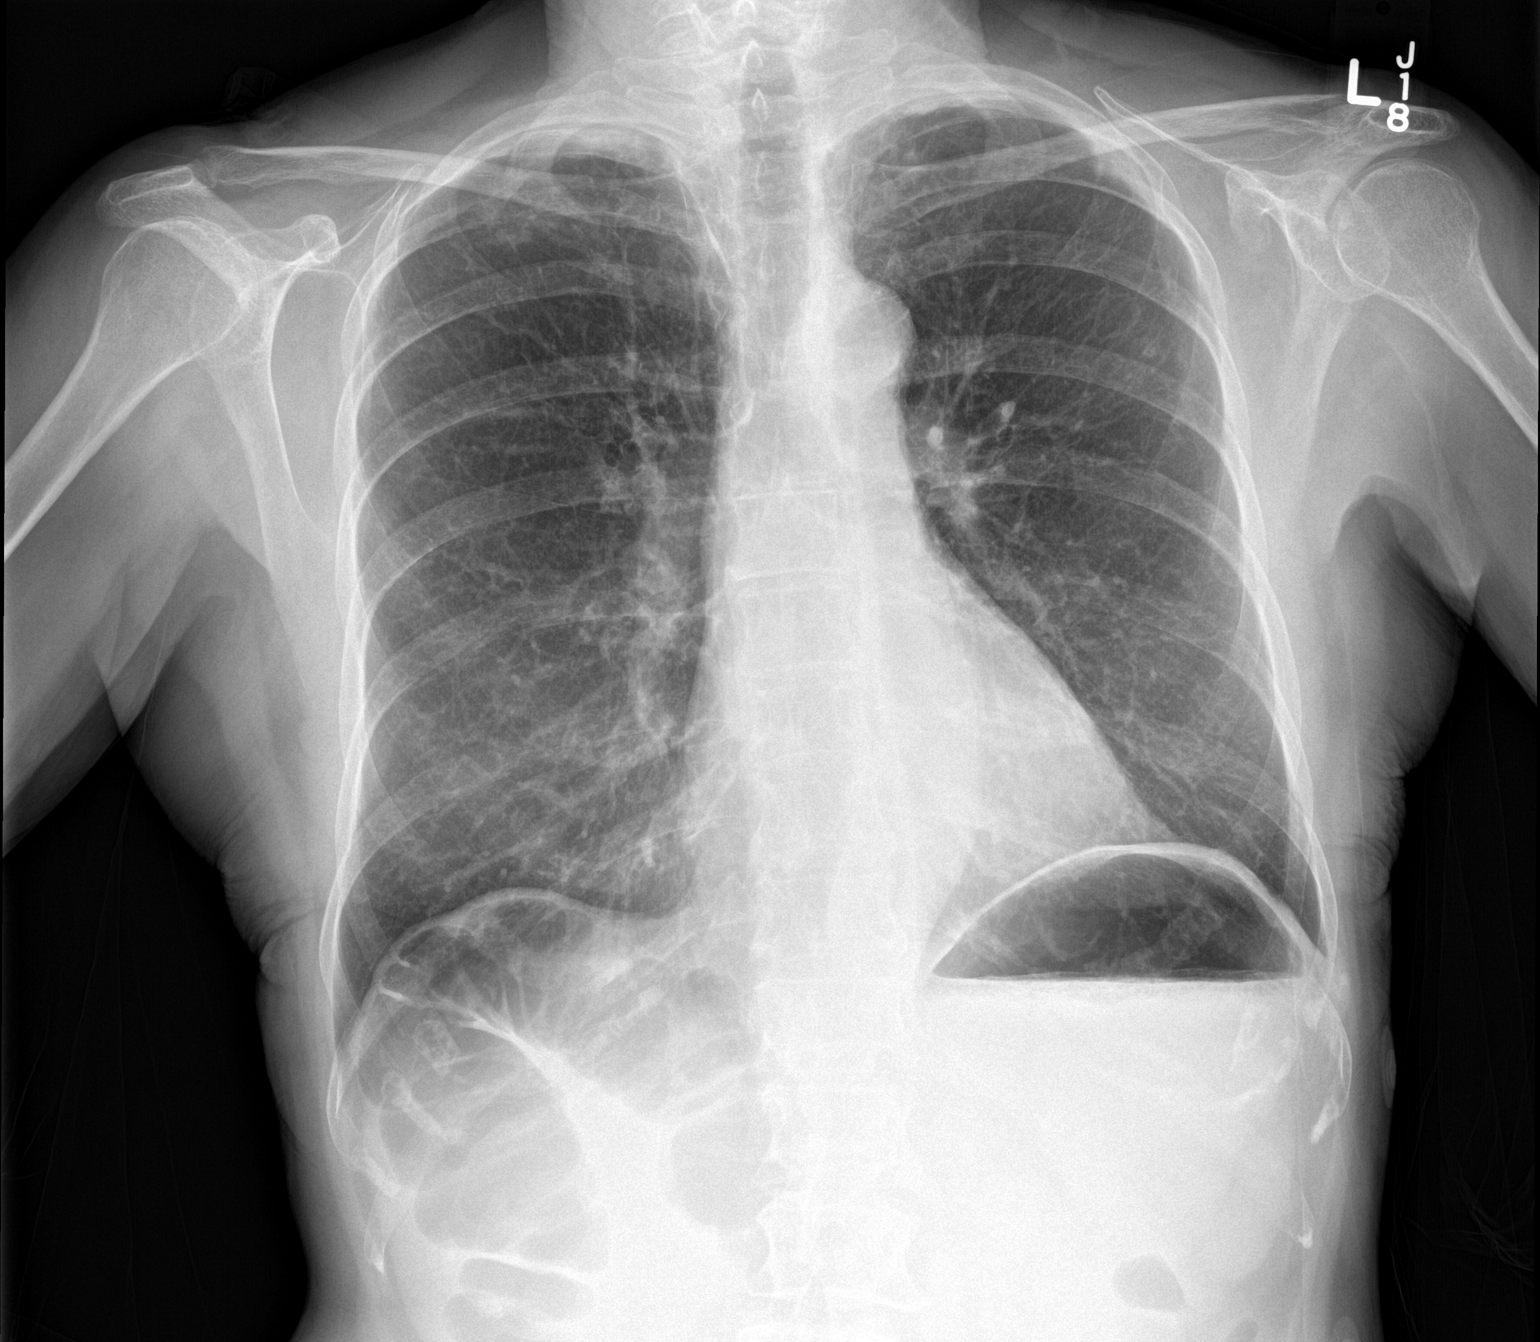

[chest lat]
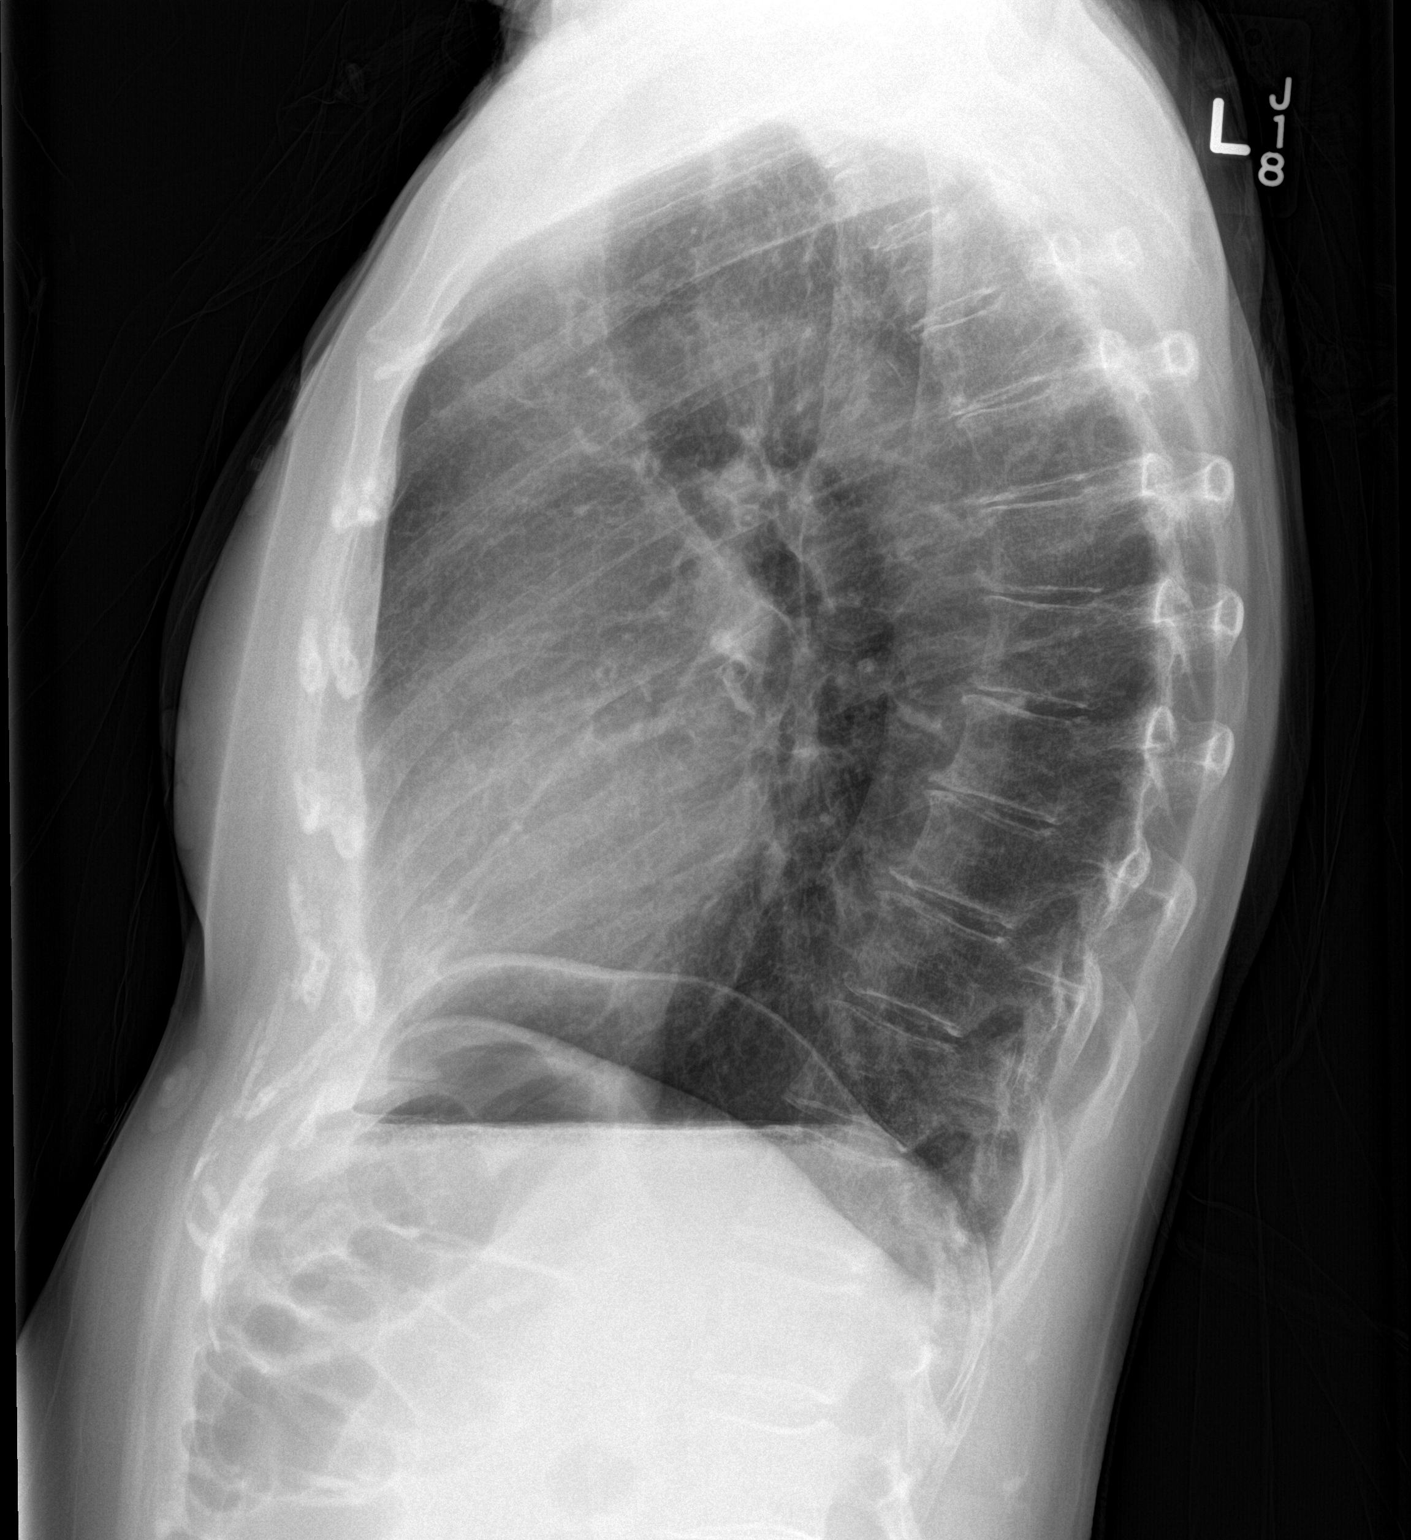

[2 of 2 positions shown; findings below may reference images not displayed]

FINDINGS: Normal heart size and pulmonary vascularity.

Tortuosity of thoracic aorta with mild atherosclerotic calcification
at arch.

Emphysematous and bronchitic changes consistent with COPD.

Biapical scarring greater on RIGHT.

No definite infiltrate, pleural effusion, or pneumothorax.

Questionable nodular density RIGHT upper lobe near apex 15 mm
greatest size, increased versus previous exam.

Bowel interposition between liver and diaphragm.

Bones demineralized.
IMPRESSION: COPD changes with biapical scarring and of questionable RIGHT upper
lobe nodular density versus focus of scarring, appears more
prominent than on previous exam ; CT chest recommended to exclude
pulmonary nodule.

## 2016-12-23 ENCOUNTER — Encounter (HOSPITAL_COMMUNITY)
Admission: RE | Admit: 2016-12-23 | Discharge: 2016-12-23 | Disposition: A | Discharge status: Home / Self Care | Payer: Medicare Other | Source: Ambulatory Visit | Attending: General Surgery | Admitting: General Surgery

## 2016-12-23 ENCOUNTER — Other Ambulatory Visit: Payer: Self-pay

## 2016-12-23 ENCOUNTER — Encounter (HOSPITAL_COMMUNITY): Payer: Self-pay

## 2016-12-23 DIAGNOSIS — J449 Chronic obstructive pulmonary disease, unspecified: Secondary | ICD-10-CM | POA: Diagnosis not present

## 2016-12-23 DIAGNOSIS — G4730 Sleep apnea, unspecified: Secondary | ICD-10-CM | POA: Diagnosis not present

## 2016-12-23 DIAGNOSIS — I2510 Atherosclerotic heart disease of native coronary artery without angina pectoris: Secondary | ICD-10-CM | POA: Diagnosis not present

## 2016-12-23 DIAGNOSIS — K219 Gastro-esophageal reflux disease without esophagitis: Secondary | ICD-10-CM | POA: Diagnosis not present

## 2016-12-23 DIAGNOSIS — Z86711 Personal history of pulmonary embolism: Secondary | ICD-10-CM | POA: Diagnosis not present

## 2016-12-23 DIAGNOSIS — F319 Bipolar disorder, unspecified: Secondary | ICD-10-CM | POA: Diagnosis not present

## 2016-12-23 DIAGNOSIS — M5136 Other intervertebral disc degeneration, lumbar region: Secondary | ICD-10-CM | POA: Diagnosis not present

## 2016-12-23 DIAGNOSIS — F17210 Nicotine dependence, cigarettes, uncomplicated: Secondary | ICD-10-CM | POA: Diagnosis not present

## 2016-12-23 DIAGNOSIS — I10 Essential (primary) hypertension: Secondary | ICD-10-CM | POA: Diagnosis not present

## 2016-12-23 DIAGNOSIS — E7800 Pure hypercholesterolemia, unspecified: Secondary | ICD-10-CM | POA: Diagnosis not present

## 2016-12-23 DIAGNOSIS — K8010 Calculus of gallbladder with chronic cholecystitis without obstruction: Secondary | ICD-10-CM | POA: Diagnosis present

## 2016-12-23 DIAGNOSIS — Z79899 Other long term (current) drug therapy: Secondary | ICD-10-CM | POA: Diagnosis not present

## 2016-12-23 HISTORY — DX: Personal history of urinary calculi: Z87.442

## 2016-12-23 LAB — COMPREHENSIVE METABOLIC PANEL
ALBUMIN: 3.6 g/dL (ref 3.5–5.0)
ALT: 8 U/L — ABNORMAL LOW (ref 14–54)
ANION GAP: 10 (ref 5–15)
AST: 20 U/L (ref 15–41)
Alkaline Phosphatase: 85 U/L (ref 38–126)
BUN: 15 mg/dL (ref 6–20)
CO2: 24 mmol/L (ref 22–32)
Calcium: 9 mg/dL (ref 8.9–10.3)
Chloride: 100 mmol/L — ABNORMAL LOW (ref 101–111)
Creatinine, Ser: 0.81 mg/dL (ref 0.44–1.00)
GFR calc Af Amer: >60 mL/min (ref >60)
GFR calc non Af Amer: >60 mL/min (ref >60)
GLUCOSE: 120 mg/dL — AB (ref 65–99)
POTASSIUM: 3.9 mmol/L (ref 3.5–5.1)
SODIUM: 134 mmol/L — AB (ref 135–145)
Total Bilirubin: 0.3 mg/dL (ref 0.3–1.2)
Total Protein: 7 g/dL (ref 6.5–8.1)

## 2016-12-23 LAB — CBC WITH DIFFERENTIAL/PLATELET
BASOS PCT: 1 %
Basophils Absolute: 0.1 10*3/uL (ref 0.0–0.1)
EOS ABS: 0.2 10*3/uL (ref 0.0–0.7)
Eosinophils Relative: 3 %
HCT: 40.7 % (ref 36.0–46.0)
Hemoglobin: 13 g/dL (ref 12.0–15.0)
Lymphocytes Relative: 20 %
Lymphs Abs: 1.5 10*3/uL (ref 0.7–4.0)
MCH: 30.7 pg (ref 26.0–34.0)
MCHC: 31.9 g/dL (ref 30.0–36.0)
MCV: 96 fL (ref 78.0–100.0)
MONO ABS: 0.6 10*3/uL (ref 0.1–1.0)
MONOS PCT: 8 %
NEUTROS PCT: 68 %
Neutro Abs: 5.3 10*3/uL (ref 1.7–7.7)
Platelets: 338 10*3/uL (ref 150–400)
RBC: 4.24 MIL/uL (ref 3.87–5.11)
RDW: 15 % (ref 11.5–15.5)
WBC: 7.7 10*3/uL (ref 4.0–10.5)

## 2016-12-25 ENCOUNTER — Ambulatory Visit (HOSPITAL_COMMUNITY): Payer: Medicare Other | Admitting: Anesthesiology

## 2016-12-25 ENCOUNTER — Ambulatory Visit (HOSPITAL_COMMUNITY)
Admission: RE | Admit: 2016-12-25 | Discharge: 2016-12-25 | Disposition: A | Discharge status: Home / Self Care | Payer: Medicare Other | Source: Ambulatory Visit | Attending: General Surgery | Admitting: General Surgery

## 2016-12-25 ENCOUNTER — Encounter (HOSPITAL_COMMUNITY): Payer: Self-pay

## 2016-12-25 ENCOUNTER — Encounter (HOSPITAL_COMMUNITY)
Admission: RE | Disposition: A | Discharge status: Home / Self Care | Payer: Self-pay | Source: Ambulatory Visit | Attending: General Surgery

## 2016-12-25 DIAGNOSIS — F319 Bipolar disorder, unspecified: Secondary | ICD-10-CM | POA: Insufficient documentation

## 2016-12-25 DIAGNOSIS — I10 Essential (primary) hypertension: Secondary | ICD-10-CM | POA: Diagnosis not present

## 2016-12-25 DIAGNOSIS — K811 Chronic cholecystitis: Secondary | ICD-10-CM | POA: Diagnosis not present

## 2016-12-25 DIAGNOSIS — E7800 Pure hypercholesterolemia, unspecified: Secondary | ICD-10-CM | POA: Diagnosis not present

## 2016-12-25 DIAGNOSIS — I2510 Atherosclerotic heart disease of native coronary artery without angina pectoris: Secondary | ICD-10-CM | POA: Insufficient documentation

## 2016-12-25 DIAGNOSIS — M5136 Other intervertebral disc degeneration, lumbar region: Secondary | ICD-10-CM | POA: Insufficient documentation

## 2016-12-25 DIAGNOSIS — Z79899 Other long term (current) drug therapy: Secondary | ICD-10-CM | POA: Insufficient documentation

## 2016-12-25 DIAGNOSIS — K8010 Calculus of gallbladder with chronic cholecystitis without obstruction: Secondary | ICD-10-CM | POA: Insufficient documentation

## 2016-12-25 DIAGNOSIS — G4730 Sleep apnea, unspecified: Secondary | ICD-10-CM | POA: Insufficient documentation

## 2016-12-25 DIAGNOSIS — F17210 Nicotine dependence, cigarettes, uncomplicated: Secondary | ICD-10-CM | POA: Insufficient documentation

## 2016-12-25 DIAGNOSIS — Z86711 Personal history of pulmonary embolism: Secondary | ICD-10-CM | POA: Insufficient documentation

## 2016-12-25 DIAGNOSIS — K219 Gastro-esophageal reflux disease without esophagitis: Secondary | ICD-10-CM | POA: Insufficient documentation

## 2016-12-25 DIAGNOSIS — J449 Chronic obstructive pulmonary disease, unspecified: Secondary | ICD-10-CM | POA: Insufficient documentation

## 2016-12-25 HISTORY — PX: CHOLECYSTECTOMY: SHX55

## 2016-12-25 SURGERY — LAPAROSCOPIC CHOLECYSTECTOMY
Anesthesia: General

## 2016-12-25 MED ORDER — HEMOSTATIC AGENTS (NO CHARGE) OPTIME
TOPICAL | Status: DC | PRN
  Administered 2016-12-25: 1 via TOPICAL

## 2016-12-25 MED ORDER — FENTANYL CITRATE (PF) 100 MCG/2ML IJ SOLN
25.0000 ug | INTRAMUSCULAR | Status: DC | PRN
  Administered 2016-12-25 (×2): 25 ug via INTRAVENOUS

## 2016-12-25 MED ORDER — ROCURONIUM BROMIDE 100 MG/10ML IV SOLN
INTRAVENOUS | Status: DC | PRN
  Administered 2016-12-25: 5 mg via INTRAVENOUS
  Administered 2016-12-25: 20 mg via INTRAVENOUS

## 2016-12-25 MED ORDER — KETOROLAC TROMETHAMINE 30 MG/ML IJ SOLN
30.0000 mg | Freq: Once | INTRAMUSCULAR | Status: AC
  Administered 2016-12-25: 30 mg via INTRAVENOUS
  Filled 2016-12-25: qty 1

## 2016-12-25 MED ORDER — BUPIVACAINE HCL (PF) 0.5 % IJ SOLN
INTRAMUSCULAR | Status: AC
  Filled 2016-12-25: qty 30

## 2016-12-25 MED ORDER — PROPOFOL 10 MG/ML IV BOLUS
INTRAVENOUS | Status: AC
  Filled 2016-12-25: qty 20

## 2016-12-25 MED ORDER — LACTATED RINGERS IV SOLN
INTRAVENOUS | Status: DC
  Administered 2016-12-25: 07:00:00 via INTRAVENOUS

## 2016-12-25 MED ORDER — IPRATROPIUM-ALBUTEROL 0.5-2.5 (3) MG/3ML IN SOLN
3.0000 mL | Freq: Once | RESPIRATORY_TRACT | Status: AC
  Administered 2016-12-25: 3 mL via RESPIRATORY_TRACT

## 2016-12-25 MED ORDER — IPRATROPIUM-ALBUTEROL 0.5-2.5 (3) MG/3ML IN SOLN
RESPIRATORY_TRACT | Status: AC
  Filled 2016-12-25: qty 3

## 2016-12-25 MED ORDER — CHLORHEXIDINE GLUCONATE CLOTH 2 % EX PADS: 6.0000 | MEDICATED_PAD | Freq: Once | CUTANEOUS | Status: DC

## 2016-12-25 MED ORDER — MIDAZOLAM HCL 5 MG/5ML IJ SOLN
INTRAMUSCULAR | Status: DC | PRN
  Administered 2016-12-25: 1 mg via INTRAVENOUS

## 2016-12-25 MED ORDER — CEFAZOLIN SODIUM-DEXTROSE 2-4 GM/100ML-% IV SOLN
2.0000 g | INTRAVENOUS | Status: AC
  Administered 2016-12-25: 2 g via INTRAVENOUS
  Filled 2016-12-25: qty 100

## 2016-12-25 MED ORDER — FENTANYL CITRATE (PF) 100 MCG/2ML IJ SOLN
INTRAMUSCULAR | Status: DC | PRN
  Administered 2016-12-25 (×2): 50 ug via INTRAVENOUS
  Administered 2016-12-25 (×2): 25 ug via INTRAVENOUS

## 2016-12-25 MED ORDER — FENTANYL CITRATE (PF) 250 MCG/5ML IJ SOLN
INTRAMUSCULAR | Status: AC
  Filled 2016-12-25: qty 5

## 2016-12-25 MED ORDER — FENTANYL CITRATE (PF) 100 MCG/2ML IJ SOLN
INTRAMUSCULAR | Status: AC
  Filled 2016-12-25: qty 2

## 2016-12-25 MED ORDER — PROPOFOL 10 MG/ML IV BOLUS
INTRAVENOUS | Status: DC | PRN
  Administered 2016-12-25: 130 mg via INTRAVENOUS

## 2016-12-25 MED ORDER — EPHEDRINE SULFATE 50 MG/ML IJ SOLN
INTRAMUSCULAR | Status: DC | PRN
  Administered 2016-12-25: 10 mg via INTRAVENOUS

## 2016-12-25 MED ORDER — SUGAMMADEX SODIUM 200 MG/2ML IV SOLN
INTRAVENOUS | Status: DC | PRN
  Administered 2016-12-25: 200 mg via INTRAVENOUS

## 2016-12-25 MED ORDER — POVIDONE-IODINE 10 % EX OINT
TOPICAL_OINTMENT | CUTANEOUS | Status: AC
  Filled 2016-12-25: qty 1

## 2016-12-25 MED ORDER — MIDAZOLAM HCL 2 MG/2ML IJ SOLN
INTRAMUSCULAR | Status: AC
  Filled 2016-12-25: qty 2

## 2016-12-25 MED ORDER — MIDAZOLAM HCL 2 MG/2ML IJ SOLN
1.0000 mg | INTRAMUSCULAR | Status: AC
  Administered 2016-12-25: 1 mg via INTRAVENOUS
  Filled 2016-12-25: qty 2

## 2016-12-25 MED ORDER — LIDOCAINE HCL 1 % IJ SOLN
INTRAMUSCULAR | Status: DC | PRN
  Administered 2016-12-25: 20 mg via INTRADERMAL

## 2016-12-25 MED ORDER — BUPIVACAINE HCL (PF) 0.5 % IJ SOLN
INTRAMUSCULAR | Status: DC | PRN
  Administered 2016-12-25: 10 mL

## 2016-12-25 MED ORDER — POVIDONE-IODINE 10 % OINT PACKET: TOPICAL_OINTMENT | CUTANEOUS | Status: DC | PRN

## 2016-12-25 MED ORDER — SUCCINYLCHOLINE CHLORIDE 20 MG/ML IJ SOLN
INTRAMUSCULAR | Status: DC | PRN
  Administered 2016-12-25: 130 mg via INTRAVENOUS

## 2016-12-25 SURGICAL SUPPLY — 50 items
ADH SKN CLS APL DERMABOND .7 (GAUZE/BANDAGES/DRESSINGS) ×1
ADH SKN CLS LQ APL DERMABOND (GAUZE/BANDAGES/DRESSINGS) ×1
APPLIER CLIP ROT 10 11.4 M/L (STAPLE) ×3
APR CLP MED LRG 11.4X10 (STAPLE) ×1
BAG HAMPER (MISCELLANEOUS) ×3 IMPLANT
BAG RETRIEVAL 10 (BASKET) ×1
BAG RETRIEVAL 10MM (BASKET) ×1
CHLORAPREP W/TINT 26ML (MISCELLANEOUS) ×3 IMPLANT
CLIP APPLIE ROT 10 11.4 M/L (STAPLE) ×1 IMPLANT
CLOTH BEACON ORANGE TIMEOUT ST (SAFETY) ×3 IMPLANT
COVER LIGHT HANDLE STERIS (MISCELLANEOUS) ×6 IMPLANT
DECANTER SPIKE VIAL GLASS SM (MISCELLANEOUS) ×3 IMPLANT
DERMABOND ADHESIVE PROPEN (GAUZE/BANDAGES/DRESSINGS) ×2
DERMABOND ADVANCED (GAUZE/BANDAGES/DRESSINGS) ×2
DERMABOND ADVANCED .7 DNX12 (GAUZE/BANDAGES/DRESSINGS) ×1 IMPLANT
DERMABOND ADVANCED .7 DNX6 (GAUZE/BANDAGES/DRESSINGS) ×1 IMPLANT
ELECT REM PT RETURN 9FT ADLT (ELECTROSURGICAL) ×3
ELECTRODE REM PT RTRN 9FT ADLT (ELECTROSURGICAL) ×1 IMPLANT
FILTER SMOKE EVAC LAPAROSHD (FILTER) ×3 IMPLANT
GLOVE BIOGEL PI IND STRL 7.0 (GLOVE) ×1 IMPLANT
GLOVE BIOGEL PI INDICATOR 7.0 (GLOVE) ×2
GLOVE SURG SS PI 7.5 STRL IVOR (GLOVE) ×3 IMPLANT
GOWN STRL REUS W/ TWL XL LVL3 (GOWN DISPOSABLE) ×1 IMPLANT
GOWN STRL REUS W/TWL LRG LVL3 (GOWN DISPOSABLE) ×6 IMPLANT
GOWN STRL REUS W/TWL XL LVL3 (GOWN DISPOSABLE) ×3
HEMOSTAT SNOW SURGICEL 2X4 (HEMOSTASIS) ×3 IMPLANT
INST SET LAPROSCOPIC AP (KITS) ×3 IMPLANT
IV NS IRRIG 3000ML ARTHROMATIC (IV SOLUTION) IMPLANT
KIT ROOM TURNOVER APOR (KITS) ×3 IMPLANT
MANIFOLD NEPTUNE II (INSTRUMENTS) ×3 IMPLANT
NEEDLE INSUFFLATION 14GA 120MM (NEEDLE) ×3 IMPLANT
NS IRRIG 1000ML POUR BTL (IV SOLUTION) ×3 IMPLANT
PACK LAP CHOLE LZT030E (CUSTOM PROCEDURE TRAY) ×3 IMPLANT
PAD ARMBOARD 7.5X6 YLW CONV (MISCELLANEOUS) ×3 IMPLANT
SET BASIN LINEN APH (SET/KITS/TRAYS/PACK) ×3 IMPLANT
SET TUBE IRRIG SUCTION NO TIP (IRRIGATION / IRRIGATOR) IMPLANT
SLEEVE ENDOPATH XCEL 5M (ENDOMECHANICALS) ×3 IMPLANT
SPONGE GAUZE 2X2 8PLY STER LF (GAUZE/BANDAGES/DRESSINGS) ×4
SPONGE GAUZE 2X2 8PLY STRL LF (GAUZE/BANDAGES/DRESSINGS) ×8 IMPLANT
STAPLER VISISTAT (STAPLE) ×3 IMPLANT
SUT VICRYL 0 UR6 27IN ABS (SUTURE) ×3 IMPLANT
SYS BAG RETRIEVAL 10MM (BASKET) ×1
SYSTEM BAG RETRIEVAL 10MM (BASKET) ×1 IMPLANT
TROCAR ENDO BLADELESS 11MM (ENDOMECHANICALS) ×3 IMPLANT
TROCAR XCEL NON-BLD 5MMX100MML (ENDOMECHANICALS) ×3 IMPLANT
TROCAR XCEL UNIV SLVE 11M 100M (ENDOMECHANICALS) ×3 IMPLANT
TUBE CONNECTING 12'X1/4 (SUCTIONS) ×1
TUBE CONNECTING 12X1/4 (SUCTIONS) ×2 IMPLANT
TUBING INSUFFLATION (TUBING) ×3 IMPLANT
WARMER LAPAROSCOPE (MISCELLANEOUS) ×3 IMPLANT

## 2016-12-25 NOTE — Discharge Instructions (Signed)
Follow up on Thursday, January 3 at 11:00   Laparoscopic Cholecystectomy, Care After This sheet gives you information about how to care for yourself after your procedure. Your doctor may also give you more specific instructions. If you have problems or questions, contact your doctor. Follow these instructions at home: Care for cuts from surgery (incisions)   Follow instructions from your doctor about how to take care of your cuts from surgery. Make sure you: ? Wash your hands with soap and water before you change your bandage (dressing). If you cannot use soap and water, use hand sanitizer. ? Change your bandage as told by your doctor. ? Leave stitches (sutures), skin glue, or skin tape (adhesive) strips in place. They may need to stay in place for 2 weeks or longer. If tape strips get loose and curl up, you may trim the loose edges. Do not remove tape strips completely unless your doctor says it is okay.  Do not take baths, swim, or use a hot tub until your doctor says it is okay. Ask your doctor if you can take showers. You may only be allowed to take sponge baths for bathing.  Check your surgical cut area every day for signs of infection. Check for: ? More redness, swelling, or pain. ? More fluid or blood. ? Warmth. ? Pus or a bad smell. Activity  Do not drive or use heavy machinery while taking prescription pain medicine.  Do not lift anything that is heavier than 10 lb (4.5 kg) until your doctor says it is okay.  Do not play contact sports until your doctor says it is okay.  Do not drive for 24 hours if you were given a medicine to help you relax (sedative).  Rest as needed. Do not return to work or school until your doctor says it is okay. General instructions  Take over-the-counter and prescription medicines only as told by your doctor.  To prevent or treat constipation while you are taking prescription pain medicine, your doctor may recommend that you: ? Drink enough  fluid to keep your pee (urine) clear or pale yellow. ? Take over-the-counter or prescription medicines. ? Eat foods that are high in fiber, such as fresh fruits and vegetables, whole grains, and beans. ? Limit foods that are high in fat and processed sugars, such as fried and sweet foods. Contact a doctor if:  You develop a rash.  You have more redness, swelling, or pain around your surgical cuts.  You have more fluid or blood coming from your surgical cuts.  Your surgical cuts feel warm to the touch.  You have pus or a bad smell coming from your surgical cuts.  You have a fever.  One or more of your surgical cuts breaks open. Get help right away if:  You have trouble breathing.  You have chest pain.  You have pain that is getting worse in your shoulders.  You faint or feel dizzy when you stand.  You have very bad pain in your belly (abdomen).  You are sick to your stomach (nauseous) for more than one day.  You have throwing up (vomiting) that lasts for more than one day.  You have leg pain. This information is not intended to replace advice given to you by your health care provider. Make sure you discuss any questions you have with your health care provider. Document Released: 10/03/2007 Document Revised: 07/15/2015 Document Reviewed: 06/12/2015 Elsevier Interactive Patient Education  2018 [employer]   PATIENT INSTRUCTIONS POST-ANESTHESIA  IMMEDIATELY FOLLOWING SURGERY:  Do not drive or operate machinery for the first twenty four hours after surgery.  Do not make any important decisions for twenty four hours after surgery or while taking narcotic pain medications or sedatives.  If you develop intractable nausea and vomiting or a severe headache please notify your doctor immediately.  FOLLOW-UP:  Please make an appointment with your surgeon as instructed. You do not need to follow up with anesthesia unless specifically instructed to do so.  WOUND CARE INSTRUCTIONS  (if applicable):  Keep a dry clean dressing on the anesthesia/puncture wound site if there is drainage.  Once the wound has quit draining you may leave it open to air.  Generally you should leave the bandage intact for twenty four hours unless there is drainage.  If the epidural site drains for more than 36-48 hours please call the anesthesia department.  QUESTIONS?:  Please feel free to call your physician or the hospital operator if you have any questions, and they will be happy to assist you.

## 2016-12-25 NOTE — Interval H&P Note (Signed)
History and Physical Interval Note:  12/25/2016 7:14 AM  Christine Rogers  has presented today for surgery, with the diagnosis of chronic cholecystitis  The various methods of treatment have been discussed with the patient and family. After consideration of risks, benefits and other options for treatment, the patient has consented to  Procedure(s): LAPAROSCOPIC CHOLECYSTECTOMY (N/A) as a surgical intervention .  The patient's history has been reviewed, patient examined, no change in status, stable for surgery.  I have reviewed the patient's chart and labs.  Questions were answered to the patient's satisfaction.     Franky MachoMark Lucas Exline

## 2016-12-25 NOTE — Transfer of Care (Signed)
Immediate Anesthesia Transfer of Care Note  Patient: Christine Rogers  Procedure(s) Performed: LAPAROSCOPIC CHOLECYSTECTOMY (N/A )  Patient Location: PACU  Anesthesia Type:General  Level of Consciousness: awake and patient cooperative  Airway & Oxygen Therapy: Patient Spontanous Breathing and Patient connected to face mask oxygen  Post-op Assessment: Report given to RN, Post -op Vital signs reviewed and stable and Patient moving all extremities  Post vital signs: Reviewed and stable  Last Vitals:  Vitals:   12/25/16 0623 12/25/16 0640  BP: 134/61 134/61  Pulse: 63   Resp:  10  Temp: 36.6 C   SpO2: 95% 95%    Last Pain:  Vitals:   12/25/16 0623  TempSrc: Oral  PainSc: 8          Complications: No apparent anesthesia complications

## 2016-12-25 NOTE — Anesthesia Postprocedure Evaluation (Signed)
Anesthesia Post Note  Patient: Christine Rogers  Procedure(s) Performed: LAPAROSCOPIC CHOLECYSTECTOMY (N/A )  Patient location during evaluation: PACU Anesthesia Type: General Level of consciousness: awake and patient cooperative Pain management: pain level controlled Vital Signs Assessment: post-procedure vital signs reviewed and stable Respiratory status: spontaneous breathing, nonlabored ventilation and respiratory function stable Cardiovascular status: blood pressure returned to baseline Postop Assessment: no apparent nausea or vomiting Anesthetic complications: no     Last Vitals:  Vitals:   12/25/16 0907 12/25/16 0945  BP:  (!) 148/72  Pulse:  66  Resp: 18 15  Temp:  36.7 C  SpO2: 91% 95%    Last Pain:  Vitals:   12/25/16 0945  TempSrc: Oral  PainSc: 4                  Dorismar Chay J

## 2016-12-25 NOTE — Anesthesia Procedure Notes (Signed)
Procedure Name: Intubation Date/Time: 12/25/2016 7:39 AM Performed by: Charmaine Downs, CRNA Pre-anesthesia Checklist: Patient identified, Patient being monitored, Timeout performed, Emergency Drugs available and Suction available Patient Re-evaluated:Patient Re-evaluated prior to induction Oxygen Delivery Method: Circle System Utilized Preoxygenation: Pre-oxygenation with 100% oxygen Induction Type: IV induction, Rapid sequence and Cricoid Pressure applied Ventilation: Mask ventilation without difficulty Laryngoscope Size: Mac and 3 Grade View: Grade II Tube type: Oral Tube size: 7.0 mm Number of attempts: 1 Airway Equipment and Method: stylet Placement Confirmation: ETT inserted through vocal cords under direct vision,  positive ETCO2 and breath sounds checked- equal and bilateral Secured at: 22 cm Tube secured with: Tape Dental Injury: Teeth and Oropharynx as per pre-operative assessment

## 2016-12-25 NOTE — Anesthesia Preprocedure Evaluation (Signed)
Anesthesia Evaluation  Patient identified by MRN, date of birth, ID band Patient awake    Reviewed: Allergy & Precautions, NPO status , Patient's Chart, lab work & pertinent test results  Airway Mallampati: II  TM Distance: >3 FB     Dental  (+) Edentulous Upper, Edentulous Lower   Pulmonary sleep apnea , COPD (emphysema), Current Smoker,    breath sounds clear to auscultation       Cardiovascular hypertension, + CAD and + Peripheral Vascular Disease   Rhythm:Regular Rate:Normal     Neuro/Psych PSYCHIATRIC DISORDERS Depression Bipolar Disorder    GI/Hepatic GERD  ,  Endo/Other    Renal/GU      Musculoskeletal   Abdominal   Peds  Hematology   Anesthesia Other Findings   Reproductive/Obstetrics                             Anesthesia Physical Anesthesia Plan  ASA: III  Anesthesia Plan: General   Post-op Pain Management:    Induction: Intravenous, Rapid sequence and Cricoid pressure planned  PONV Risk Score and Plan:   Airway Management Planned: Oral ETT  Additional Equipment:   Intra-op Plan:   Post-operative Plan: Extubation in OR  Informed Consent: I have reviewed the patients History and Physical, chart, labs and discussed the procedure including the risks, benefits and alternatives for the proposed anesthesia with the patient or authorized representative who has indicated his/her understanding and acceptance.     Plan Discussed with:   Anesthesia Plan Comments:         Anesthesia Quick Evaluation

## 2016-12-25 NOTE — Op Note (Signed)
Patient:  Christine DunkBarbara H Jane  DOB:  1940-12-16  MRN:  [id number]   Preop Diagnosis: Chronic cholecystitis  Postop Diagnosis: Same  Procedure: Laparoscopic cholecystectomy  Surgeon: Franky MachoMark Baelynn Schmuhl, MD  Anes: General endotracheal  Indications: Patient is a 76 year old white female who presents with biliary colic secondary to chronic cholecystitis.  The risks and benefits of the procedure including bleeding, infection, hepatobiliary injury, the possibility of recurrence of these symptoms, and the possibility of an open procedure were fully explained to the patient, who gave informed consent.  Procedure note: Patient was placed in supine position.  After induction of general endotracheal anesthesia, the abdomen was prepped and draped using the usual sterile technique with DuraPrep.  Surgical site confirmation was performed.  An infraumbilical incision was made down to the fascia.  A Veress needle was introduced into the abdominal cavity and confirmation of placement was done using the saline drop test.  The abdomen was then insufflated to 16 mmHg pressure.  An 11 mm trocar was introduced into the abdominal cavity under direct visualization without difficulty.  The patient was placed in reverse Trendelenburg position and an additional 11 mm trocar was placed in the epigastric region and 5 mm trochars were placed in the right upper quadrant and right flank regions.  The liver was inspected and noted to be age-appropriate.  The gallbladder was retracted in a dynamic fashion in order to provide a critical view of the triangle of Calot.  The cystic duct was first identified.  Its juncture to the infundibulum was fully identified.  Endoclips were placed proximally and distally on the cystic duct, and the cystic duct was divided.  This was likewise done to the cystic artery.  The gallbladder was freed away from the gallbladder fossa using Bovie electrocautery.  The gallbladder was delivered through the epigastric  trocar site using an Endo Catch bag.  The gallbladder fossa was inspected and no abnormal bleeding or bile leakage was noted.  Surgicel was placed in the gallbladder fossa.  All fluid and air were then evacuated from the abdominal cavity prior to removal of the trochars.  All wounds were irrigated with normal saline.  All wounds were injected with 0.5% Sensorcaine.  The infraumbilical fascia was reapproximated using an 0 Vicryl interrupted suture.  All skin incisions were closed using 4-0 Monocryl subcuticular suture.  Dermabond was applied.  All tape needle counts were correct at the end of the procedure.  Patient was extubated in the operating room and transferred to PACU in stable condition.  Complications: None  EBL: Minimal  Specimen: Gallbladder

## 2016-12-27 ENCOUNTER — Encounter (HOSPITAL_COMMUNITY): Payer: Self-pay | Admitting: General Surgery

## 2017-01-09 ENCOUNTER — Encounter: Payer: Self-pay | Admitting: General Surgery

## 2017-01-09 ENCOUNTER — Ambulatory Visit (INDEPENDENT_AMBULATORY_CARE_PROVIDER_SITE_OTHER): Payer: Self-pay | Admitting: General Surgery

## 2017-01-09 ENCOUNTER — Ambulatory Visit: Payer: Medicare Other | Admitting: General Surgery

## 2017-01-09 VITALS — BP 155/76 | HR 87 | Temp 97.8°F | Ht 61.0 in | Wt 103.0 lb

## 2017-01-09 DIAGNOSIS — Z09 Encounter for follow-up examination after completed treatment for conditions other than malignant neoplasm: Secondary | ICD-10-CM

## 2017-01-09 NOTE — Progress Notes (Signed)
Subjective:     Christine Rogers  Status post laparoscopic cholecystectomy.  She is doing very well.  Her preoperative symptoms have resolved. Objective:    BP (!) 155/76   Pulse 87   Temp 97.8 F (36.6 C)   Ht 5\' 1"  (1.549 m)   Wt 103 lb (46.7 kg)   BMI 19.46 kg/m   General:  alert, cooperative and no distress  Abdomen soft, incisions healing well. Final pathology consistent with diagnosis.     Assessment:    Doing well postoperatively.    Plan:   Resume normal activity.  Follow-up as needed.  Patient pleased with results.

## 2017-02-23 ENCOUNTER — Emergency Department (HOSPITAL_COMMUNITY)
Admission: EM | Admit: 2017-02-23 | Discharge: 2017-02-23 | Disposition: A | Discharge status: Home / Self Care | Payer: Medicare Other | Attending: Emergency Medicine | Admitting: Emergency Medicine

## 2017-02-23 ENCOUNTER — Other Ambulatory Visit: Payer: Self-pay

## 2017-02-23 ENCOUNTER — Encounter (HOSPITAL_COMMUNITY): Payer: Self-pay

## 2017-02-23 ENCOUNTER — Emergency Department (HOSPITAL_COMMUNITY): Payer: Medicare Other

## 2017-02-23 DIAGNOSIS — I10 Essential (primary) hypertension: Secondary | ICD-10-CM | POA: Insufficient documentation

## 2017-02-23 DIAGNOSIS — J209 Acute bronchitis, unspecified: Secondary | ICD-10-CM | POA: Diagnosis not present

## 2017-02-23 DIAGNOSIS — R110 Nausea: Secondary | ICD-10-CM | POA: Diagnosis present

## 2017-02-23 DIAGNOSIS — F17210 Nicotine dependence, cigarettes, uncomplicated: Secondary | ICD-10-CM | POA: Diagnosis not present

## 2017-02-23 DIAGNOSIS — J449 Chronic obstructive pulmonary disease, unspecified: Secondary | ICD-10-CM | POA: Diagnosis not present

## 2017-02-23 DIAGNOSIS — Z79899 Other long term (current) drug therapy: Secondary | ICD-10-CM | POA: Diagnosis not present

## 2017-02-23 LAB — CBC WITH DIFFERENTIAL/PLATELET
BASOS ABS: 0 10*3/uL (ref 0.0–0.1)
BASOS PCT: 0 %
EOS ABS: 0.1 10*3/uL (ref 0.0–0.7)
EOS PCT: 1 %
HCT: 40.4 % (ref 36.0–46.0)
Hemoglobin: 13.3 g/dL (ref 12.0–15.0)
Lymphocytes Relative: 28 %
Lymphs Abs: 2.3 10*3/uL (ref 0.7–4.0)
MCH: 30.8 pg (ref 26.0–34.0)
MCHC: 32.9 g/dL (ref 30.0–36.0)
MCV: 93.5 fL (ref 78.0–100.0)
MONO ABS: 0.7 10*3/uL (ref 0.1–1.0)
Monocytes Relative: 8 %
Neutro Abs: 5.1 10*3/uL (ref 1.7–7.7)
Neutrophils Relative %: 63 %
PLATELETS: 291 10*3/uL (ref 150–400)
RBC: 4.32 MIL/uL (ref 3.87–5.11)
RDW: 14.5 % (ref 11.5–15.5)
WBC: 8.1 10*3/uL (ref 4.0–10.5)

## 2017-02-23 LAB — COMPREHENSIVE METABOLIC PANEL
ALBUMIN: 3.8 g/dL (ref 3.5–5.0)
ALT: 8 U/L — ABNORMAL LOW (ref 14–54)
AST: 17 U/L (ref 15–41)
Alkaline Phosphatase: 106 U/L (ref 38–126)
Anion gap: 15 (ref 5–15)
BUN: 15 mg/dL (ref 6–20)
CHLORIDE: 108 mmol/L (ref 101–111)
CO2: 22 mmol/L (ref 22–32)
Calcium: 9.6 mg/dL (ref 8.9–10.3)
Creatinine, Ser: 1.06 mg/dL — ABNORMAL HIGH (ref 0.44–1.00)
GFR calc Af Amer: 58 mL/min — ABNORMAL LOW (ref >60)
GFR, EST NON AFRICAN AMERICAN: 50 mL/min — AB (ref >60)
GLUCOSE: 97 mg/dL (ref 65–99)
POTASSIUM: 3.4 mmol/L — AB (ref 3.5–5.1)
SODIUM: 145 mmol/L (ref 135–145)
Total Bilirubin: 0.2 mg/dL — ABNORMAL LOW (ref 0.3–1.2)
Total Protein: 7.6 g/dL (ref 6.5–8.1)

## 2017-02-23 LAB — URINALYSIS, ROUTINE W REFLEX MICROSCOPIC
Bilirubin Urine: NEGATIVE
GLUCOSE, UA: NEGATIVE mg/dL
Hgb urine dipstick: NEGATIVE
Ketones, ur: NEGATIVE mg/dL
LEUKOCYTES UA: NEGATIVE
Nitrite: NEGATIVE
PROTEIN: NEGATIVE mg/dL
SPECIFIC GRAVITY, URINE: 1.011 (ref 1.005–1.030)
pH: 5 (ref 5.0–8.0)

## 2017-02-23 LAB — LIPASE, BLOOD: LIPASE: 29 U/L (ref 11–51)

## 2017-02-23 LAB — I-STAT TROPONIN, ED: TROPONIN I, POC: 0 ng/mL (ref 0.00–0.08)

## 2017-02-23 LAB — BRAIN NATRIURETIC PEPTIDE: B NATRIURETIC PEPTIDE 5: 28 pg/mL (ref 0.0–100.0)

## 2017-02-23 LAB — I-STAT CG4 LACTIC ACID, ED: Lactic Acid, Venous: 1.15 mmol/L (ref 0.5–1.9)

## 2017-02-23 MED ORDER — VANCOMYCIN HCL IN DEXTROSE 1-5 GM/200ML-% IV SOLN: 1000.0000 mg | Freq: Once | INTRAVENOUS | Status: DC

## 2017-02-23 MED ORDER — DOXYCYCLINE HYCLATE 100 MG PO CAPS: 100.0000 mg | ORAL_CAPSULE | Freq: Two times a day (BID) | ORAL | 0 refills | Status: DC

## 2017-02-23 MED ORDER — PREDNISONE 20 MG PO TABS: 40.0000 mg | ORAL_TABLET | Freq: Every day | ORAL | 0 refills | Status: DC

## 2017-02-23 MED ORDER — OSELTAMIVIR PHOSPHATE 75 MG PO CAPS: 75.0000 mg | ORAL_CAPSULE | Freq: Two times a day (BID) | ORAL | 0 refills | Status: DC

## 2017-02-23 MED ORDER — LIDOCAINE HCL (PF) 1 % IJ SOLN: 5.0000 mL | Freq: Once | INTRAMUSCULAR | Status: DC

## 2017-02-23 NOTE — ED Triage Notes (Signed)
Pt arrives via [facility] c/o nausea, cough, and believes she has an upper respiratory infection. Pt states she has generalized body aches.

## 2017-02-23 NOTE — ED Notes (Signed)
Pt returned from X Ray.

## 2017-02-23 NOTE — ED Notes (Signed)
Patient transported to X-ray 

## 2017-02-23 NOTE — ED Provider Notes (Signed)
[facility] EMERGENCY DEPARTMENT Provider Note   CSN: [id number] Arrival date & time: 02/23/17  0013     History   Chief Complaint Chief Complaint  Patient presents with  . Nausea    Pt thinks she has an upper respiratory infection    HPI Christine Rogers is a 77 y.o. female.  Patient presents to the emergency department with multiple complaints.  She reports that she has been sick for several days.  She has had nasal congestion, cough, increased nausea without diarrhea.  She has not had any abdominal pain.  Patient reports generalized aching pains in her muscles.      Past Medical History:  Diagnosis Date  . Acute pulmonary embolism (HCC) 10/19/2015  . Bipolar 1 disorder (HCC)   . COPD (chronic obstructive pulmonary disease) (HCC)   . DDD (degenerative disc disease), lumbar   . Depression   . Emphysema of lung (HCC)   . GERD (gastroesophageal reflux disease)   . High cholesterol   . History of kidney stones   . Hypertension   . Sleep apnea    cannot tolerate CPAP    Patient Active Problem List   Diagnosis Date Noted  . Chronic cholecystitis   . Acute pulmonary embolism (HCC) 10/19/2015  . Chest pain on exertion 04/06/2015  . RUQ pain 03/08/2015  . Mucosal abnormality of stomach   . GERD (gastroesophageal reflux disease) 12/27/2014  . N&V (nausea and vomiting) 12/27/2014  . Weight gain 12/27/2014  . Constipation 12/27/2014  . Dyslipidemia 08/22/2009  . TOBACCO ABUSE 08/22/2009  . ESSENTIAL HYPERTENSION, BENIGN 08/22/2009  . CORONARY ATHEROSCLEROSIS NATIVE CORONARY ARTERY 08/22/2009  . CLAUDICATION 08/22/2009    Past Surgical History:  Procedure Laterality Date  . ABDOMINAL HYSTERECTOMY    . BACK SURGERY     lumbar fusion  . BIOPSY  01/23/2015   Procedure: BIOPSY;  Surgeon: Corbin Ade, MD;  Location: [facility] SUITE;  Service: Endoscopy;;  gastric biopsies  . CATARACT EXTRACTION, BILATERAL Bilateral   . CHOLECYSTECTOMY N/A 12/25/2016   Procedure:  LAPAROSCOPIC CHOLECYSTECTOMY;  Surgeon: Franky Macho, MD;  Location: [facility] ORS;  Service: General;  Laterality: N/A;  . ESOPHAGOGASTRODUODENOSCOPY (EGD) WITH PROPOFOL N/A 01/23/2015   RMR: abnormal gastric mucosa.pyloric channel of uncertain significance status post biopys  . TONSILLECTOMY      OB History    Gravida Para Term Preterm AB Living   5 5 4 1   4    SAB TAB Ectopic Multiple Live Births                   Home Medications    Prior to Admission medications   Medication Sig Start Date End Date Taking? Authorizing Provider  amLODipine (NORVASC) 5 MG tablet Take 5 mg by mouth daily.  01/17/16  Yes [provider]  cloNIDine (CATAPRES) 0.1 MG tablet Take 0.1 mg by mouth 2 (two) times daily.  01/02/16  Yes [provider]  DEXILANT 60 MG capsule TAKE ONE (1) CAPSULE EACH DAY Patient taking differently: Take 60 mg by mouth once daily 04/11/16  Yes Gelene Mink, NP  linaclotide (LINZESS) 145 MCG CAPS capsule Take 145 mcg by mouth daily before breakfast.   Yes [provider]  ondansetron (ZOFRAN-ODT) 4 MG disintegrating tablet Take 4 mg by mouth every 8 (eight) hours as needed for nausea or vomiting.   Yes [provider]  oxyCODONE (OXYCONTIN) 40 mg 12 hr tablet Take 40 mg by mouth every 12 (twelve)  hours.  10/12/16  Yes [provider]  Oxycodone HCl 20 MG TABS Take 20 mg by mouth every 12 (twelve) hours.  10/08/16  Yes [provider]  PROAIR HFA 108 (90 Base) MCG/ACT inhaler Inhale 1-2 puffs into the lungs every 6 (six) hours as needed for wheezing or shortness of breath.  09/11/16  Yes [provider]  promethazine (PHENERGAN) 25 MG suppository Place 25 mg rectally at bedtime as needed for nausea or vomiting.   Yes [provider]  promethazine (PHENERGAN) 25 MG tablet Take 25 mg by mouth every 6 (six) hours as needed for nausea or vomiting.   Yes [provider]  Aspirin-Salicylamide-Caffeine (BC HEADACHE  POWDER PO) Take 1 packet by mouth as needed (for pain or headache).    [provider]  clonazePAM (KLONOPIN) 0.5 MG tablet Take 0.5 mg by mouth at bedtime as needed for anxiety.  02/12/16   [provider]  doxycycline (VIBRAMYCIN) 100 MG capsule Take 1 capsule (100 mg total) by mouth 2 (two) times daily. 02/23/17   [name], MD  oseltamivir (TAMIFLU) 75 MG capsule Take 1 capsule (75 mg total) by mouth every 12 (twelve) hours. 02/23/17   [name], MD  predniSONE (DELTASONE) 20 MG tablet Take 2 tablets (40 mg total) by mouth daily with breakfast. 02/23/17   [name], Canary Brim, MD    Family History Family History  Problem Relation Age of Onset  . Heart disease Father   . Heart attack Father   . Heart attack Brother   . Heart Problems Brother   . Heart attack Brother   . Cancer Child   . Cerebral palsy Daughter   . Colon cancer Neg Hx     Social History Social History   Tobacco Use  . Smoking status: Current Some Day Smoker    Packs/day: 1.00    Years: 50.00    Pack years: 50.00    Types: Cigarettes    Start date: 01/30/1963  . Smokeless tobacco: Never Used  Substance Use Topics  . Alcohol use: No  . Drug use: No     Allergies   Imitrex [sumatriptan]; Ciprofloxacin; Fentanyl; and Demerol [meperidine]   Review of Systems Review of Systems  HENT: Positive for congestion.   Respiratory: Positive for cough.   Musculoskeletal: Positive for myalgias.  All other systems reviewed and are negative.    Physical Exam Updated Vital Signs BP 124/73   Pulse 79   Temp 97.9 F (36.6 C) (Oral)   Resp (!) 22   Ht 5\' 1"  (1.549 m)   Wt 44.9 kg (99 lb)   SpO2 97%   BMI 18.71 kg/m   Physical Exam  Constitutional: She is oriented to person, place, and time. She appears well-developed and well-nourished. No distress.  HENT:  Head: Normocephalic and atraumatic.  Right Ear: Hearing normal.  Left Ear: Hearing normal.  Nose: Nose  normal.  Mouth/Throat: Oropharynx is clear and moist and mucous membranes are normal.  Eyes: Conjunctivae and EOM are normal. Pupils are equal, round, and reactive to light.  Neck: Normal range of motion. Neck supple.  Cardiovascular: Regular rhythm, S1 normal and S2 normal. Exam reveals no gallop and no friction rub.  No murmur heard. Pulmonary/Chest: Effort normal. No respiratory distress. She has wheezes. She has rhonchi. She exhibits no tenderness.  Abdominal: Soft. Normal appearance and bowel sounds are normal. There is no hepatosplenomegaly. There is no tenderness. There is no rebound, no guarding, no  tenderness at McBurney's point and negative Murphy's sign. No hernia.  Musculoskeletal: Normal range of motion.  Neurological: She is alert and oriented to person, place, and time. She has normal strength. No cranial nerve deficit or sensory deficit. Coordination normal. GCS eye subscore is 4. GCS verbal subscore is 5. GCS motor subscore is 6.  Skin: Skin is warm, dry and intact. No rash noted. No cyanosis.  Psychiatric: She has a normal mood and affect. Her speech is normal and behavior is normal. Thought content normal.  Nursing note and vitals reviewed.    ED Treatments / Results  Labs (all labs ordered are listed, but only abnormal results are displayed) Labs Reviewed  COMPREHENSIVE METABOLIC PANEL - Abnormal; Notable for the following components:      Result Value   Potassium 3.4 (*)    Creatinine, Ser 1.06 (*)    ALT 8 (*)    Total Bilirubin 0.2 (*)    GFR calc non Af Amer 50 (*)    GFR calc Af Amer 58 (*)    All other components within normal limits  CBC WITH DIFFERENTIAL/PLATELET  LIPASE, BLOOD  URINALYSIS, ROUTINE W REFLEX MICROSCOPIC  BRAIN NATRIURETIC PEPTIDE  I-STAT CG4 LACTIC ACID, ED  I-STAT TROPONIN, ED    EKG  EKG Interpretation  Date/Time:  Sunday February 23 2017 00:51:38 EST Ventricular Rate:  76 PR Interval:    QRS Duration: 95 QT Interval:  431 QTC  Calculation: 485 R Axis:   -2 Text Interpretation:  Sinus rhythm Borderline T wave abnormalities Confirmed by [name] [id number]) on 02/23/2017 1:44:02 AM       Radiology Dg Chest 2 View  Result Date: 02/23/2017 CLINICAL DATA:  Cough and congestion EXAM: CHEST  2 VIEW COMPARISON:  Chest radiograph 03/22/2016 FINDINGS: Seen lungs are hyperinflated. Cardiomediastinal contours are normal. No focal airspace consolidation or pulmonary edema. No pneumothorax or pleural effusion. IMPRESSION: Hyperinflated lungs may indicate COPD.  No focal airspace disease. Electronically Signed   By: Deatra Robinson M.D.   On: 02/23/2017 01:07    Procedures Procedures (including critical care time)  Medications Ordered in ED Medications - No data to display   Initial Impression / Assessment and Plan / ED Course  I have reviewed the triage vital signs and the nursing notes.  Pertinent labs & imaging results that were available during my care of the patient were reviewed by me and considered in my medical decision making (see chart for details).     Patient presents to the emergency department for evaluation of multiple complaints.  Patient reports that she has been experiencing severe nausea, requiring Phenergan and Zofran.  This seems to be a somewhat chronic problem for her, however.  She has not been eating or drinking much.  She has had some chest congestion with cough.  She reports generalized weakness.  Examination arrival revealed diffuse wheezing.  She has not hypoxic.  Patient is afebrile and all of her vital signs are normal.  Workup has been reassuring.  She has a normal troponin, and no evidence of infection.  She has normal white blood cell count and lactic acid.  Comprehensive metabolic panel and lipase are normal.  Urinalysis does not show any signs of infection.  Chest x-ray does not show evidence of pneumonia.  At this point in the workup, patient reports that she is unable to stay any  longer, wishes to be discharged.  As I have not found any significant findings on her workup, will  be discharged with treatment for bronchitis/COPD.  Follow-up with your primary doctor, return if her symptoms worsen.  Final Clinical Impressions(s) / ED Diagnoses   Final diagnoses:  Nausea  Acute bronchitis, unspecified organism    ED Discharge Orders        Ordered    predniSONE (DELTASONE) 20 MG tablet  Daily with breakfast     02/23/17 0241    oseltamivir (TAMIFLU) 75 MG capsule  Every 12 hours     02/23/17 0241    doxycycline (VIBRAMYCIN) 100 MG capsule  2 times daily     02/23/17 0242       Gilda CreasePollina, Amarius Toto J, MD 02/23/17 [phone]

## 2017-06-23 ENCOUNTER — Other Ambulatory Visit: Payer: Self-pay | Admitting: Gastroenterology

## 2017-11-18 ENCOUNTER — Ambulatory Visit (INDEPENDENT_AMBULATORY_CARE_PROVIDER_SITE_OTHER): Payer: Medicare Other | Admitting: Internal Medicine

## 2017-11-18 ENCOUNTER — Encounter: Payer: Self-pay | Admitting: Internal Medicine

## 2017-11-18 ENCOUNTER — Other Ambulatory Visit: Payer: Self-pay

## 2017-11-18 VITALS — BP 155/82 | HR 90 | Temp 98.2°F | Ht 61.0 in | Wt 103.6 lb

## 2017-11-18 DIAGNOSIS — K5909 Other constipation: Secondary | ICD-10-CM

## 2017-11-18 DIAGNOSIS — R110 Nausea: Secondary | ICD-10-CM | POA: Diagnosis not present

## 2017-11-18 DIAGNOSIS — K219 Gastro-esophageal reflux disease without esophagitis: Secondary | ICD-10-CM | POA: Diagnosis not present

## 2017-11-18 DIAGNOSIS — R112 Nausea with vomiting, unspecified: Secondary | ICD-10-CM

## 2017-11-18 NOTE — Patient Instructions (Addendum)
Gastric emptying study - chronic nausea and intermittant vomiting  GERD information provided  No change in medications for now  CBC, Chem-12 today  Further recommendations to follow      Thank you for entrusting me with your care. I appreciate the opportunity to create valuable relationships with patients and family members. You may receive a questionnaire in the mail regarding your visit here today.  It would be appreciated if you would take the time to return it.  If you were not completely satisfied with your experience, I would love to discuss any concerns with you.   Jonathon Bellows. Michael Zaedyn Covin, M.D. Marcial PacasFACP FACG, [facility]

## 2017-11-18 NOTE — Progress Notes (Signed)
Primary Care Physician:  [name], DO Primary Gastroenterologist:  Dr. Jena Gauss  Pre-Procedure History & Physical: HPI:  Christine Rogers is a 77 y.o. female here for nearly incessant nausea with rare episodes of vomiting.  Dexilant controlling reflux symptoms.  Taking Carafate as needed as well.  No dysphagia.  No abdominal pain.  Has nausea in the morning and in the evening.  No prior gastric emptying study.  Planes of severe leg cramps nearly every night.  Linzess effective in managing constipation.  Past Medical History:  Diagnosis Date  . Acute pulmonary embolism (HCC) 10/19/2015  . Bipolar 1 disorder (HCC)   . COPD (chronic obstructive pulmonary disease) (HCC)   . DDD (degenerative disc disease), lumbar   . Depression   . Emphysema of lung (HCC)   . GERD (gastroesophageal reflux disease)   . High cholesterol   . History of kidney stones   . Hypertension   . Sleep apnea    cannot tolerate CPAP    Past Surgical History:  Procedure Laterality Date  . ABDOMINAL HYSTERECTOMY    . BACK SURGERY     lumbar fusion  . BIOPSY  01/23/2015   Procedure: BIOPSY;  Surgeon: Corbin Ade, MD;  [facility];  Service: Endoscopy;;  gastric biopsies  . CATARACT EXTRACTION, BILATERAL Bilateral   . CHOLECYSTECTOMY N/A 12/25/2016   Procedure: LAPAROSCOPIC CHOLECYSTECTOMY;  Surgeon: Franky Macho, MD;  Location: [facility];  Service: General;  Laterality: N/A;  . ESOPHAGOGASTRODUODENOSCOPY (EGD) WITH PROPOFOL N/A 01/23/2015   RMR: abnormal gastric mucosa.pyloric channel of uncertain significance status post biopys  . TONSILLECTOMY      Prior to Admission medications   Medication Sig Start Date End Date Taking? Authorizing Provider  Aspirin-Salicylamide-Caffeine (BC HEADACHE POWDER PO) Take 1 packet by mouth as needed (for pain or headache).   Yes [provider]  calcitRIOL (ROCALTROL) 0.5 MCG capsule Take 0.5 mcg by mouth daily.   Yes [provider]    clonazePAM (KLONOPIN) 0.5 MG tablet Take 0.5 mg by mouth at bedtime as needed for anxiety.  02/12/16  Yes [provider]  DEXILANT 60 MG capsule TAKE ONE (1) CAPSULE EACH DAY 06/23/17  Yes Gelene Mink, NP  lisinopril (PRINIVIL,ZESTRIL) 2.5 MG tablet Take 2.5 mg by mouth daily.   Yes [provider]  mirtazapine (REMERON) 15 MG tablet Take 15 mg by mouth at bedtime.   Yes [provider]  ondansetron (ZOFRAN) 8 MG tablet Take 8 mg by mouth 2 (two) times daily.   Yes [provider]  ondansetron (ZOFRAN-ODT) 4 MG disintegrating tablet Take 4 mg by mouth 4 (four) times daily.    Yes [provider]  oxyCODONE (OXYCONTIN) 40 mg 12 hr tablet Take 40 mg by mouth every 12 (twelve) hours.  10/12/16  Yes [provider]  Oxycodone HCl 20 MG TABS Take 20 mg by mouth every 12 (twelve) hours.  10/08/16  Yes [provider]  promethazine (PHENERGAN) 25 MG suppository Place 25 mg rectally at bedtime as needed for nausea or vomiting.   Yes [provider]  promethazine (PHENERGAN) 25 MG tablet Take 25 mg by mouth every 6 (six) hours as needed for nausea or vomiting.   Yes [provider]  sucralfate (CARAFATE) 1 GM/10ML suspension Take 1 g by mouth 2 (two) times daily.   Yes [provider]  amLODipine (NORVASC) 5 MG tablet Take 5 mg by mouth daily.  01/17/16   [provider]  cloNIDine (CATAPRES) 0.1 MG tablet Take 0.1 mg by mouth 2 (two) times daily.  01/02/16   [provider]  doxycycline (VIBRAMYCIN) 100 MG capsule Take 1 capsule (100 mg total) by mouth 2 (two) times daily. Patient not taking: Reported on 11/18/2017 02/23/17   Gilda CreasePollina, Christopher J, MD  linaclotide [facility](LINZESS) 145 MCG CAPS capsule Take 145 mcg by mouth daily before breakfast.    [provider]  oseltamivir (TAMIFLU) 75 MG capsule Take 1 capsule (75 mg total) by mouth every 12 (twelve) hours. Patient not taking: Reported on  11/18/2017 02/23/17   Gilda CreasePollina, Christopher J, MD  predniSONE (DELTASONE) 20 MG tablet Take 2 tablets (40 mg total) by mouth daily with breakfast. Patient not taking: Reported on 11/18/2017 02/23/17   Gilda CreasePollina, Christopher J, MD  PROAIR HFA 108 [phone](90 Base) MCG/ACT inhaler Inhale 1-2 puffs into the lungs every 6 (six) hours as needed for wheezing or shortness of breath.  09/11/16   [provider]    Allergies as of 11/18/2017 - Review Complete 11/18/2017  Allergen Reaction Noted  . Imitrex [sumatriptan] Anaphylaxis and Swelling 04/10/2014  . Ciprofloxacin Other (See Comments)   . Fentanyl Other (See Comments) 11/05/2016  . Demerol [meperidine] Nausea And Vomiting 05/30/2015    Family History  Problem Relation Age of Onset  . Heart disease Father   . Heart attack Father   . Heart attack Brother   . Heart Problems Brother   . Heart attack Brother   . Cancer Child   . Cerebral palsy Daughter   . Colon cancer Neg Hx     Social History   Socioeconomic History  . Marital status: Divorced    Spouse name: Not on file  . Number of children: Not on file  . Years of education: Not on file  . Highest education level: Not on file  Occupational History  . Not on file  Social Needs  . Financial resource strain: Not on file  . Food insecurity:    Worry: Not on file    Inability: Not on file  . Transportation needs:    Medical: Not on file    Non-medical: Not on file  Tobacco Use  . Smoking status: Current Some Day Smoker    Packs/day: 1.00    Years: 50.00    Pack years: 50.00    Types: Cigarettes    Start date: 01/30/1963  . Smokeless tobacco: Never Used  Substance and Sexual Activity  . Alcohol use: No  . Drug use: No  . Sexual activity: Not Currently    Birth control/protection: Surgical  Lifestyle  . Physical activity:    Days per week: Not on file    Minutes per session: Not on file  . Stress: Not on file  Relationships  . Social connections:    Talks on phone: Not  on file    Gets together: Not on file    Attends religious service: Not on file    Active member of club or organization: Not on file    Attends meetings of clubs or organizations: Not on file    Relationship status: Not on file  . Intimate partner violence:    Fear of current or ex partner: Not on file    Emotionally abused: Not on file    Physically abused: Not on file    Forced sexual activity: Not on file  Other Topics Concern  . Not on file  Social History Narrative  . Not  on file    Review of Systems: See HPI, otherwise negative ROS  Physical Exam: BP (!) 155/82   Pulse 90   Temp 98.2 F (36.8 C) (Oral)   Ht 5\' 1"  (1.549 m)   Wt 103 lb 9.6 oz (47 kg)   BMI 19.58 kg/m  General:   Alert,  Well-developed, well-nourished, pleasant and cooperative in NAD Mouth:  No deformity or lesions. Neck:  Supple; no masses or thyromegaly. No significant cervical adenopathy. Lungs:  Clear throughout to auscultation.   No wheezes, crackles, or rhonchi. No acute distress. Heart:  Regular rate and rhythm; no murmurs, clicks, rubs,  or gallops. Abdomen: Nondistended.  Positive bowel sounds.  No succussion splash soft and nontender.  No mass or hepatosplenomegaly pulses:  Normal pulses noted. Extremities:  Without clubbing or edema.  Impression/Plan: 77 year old lady with chronic nausea rare vomiting.  Typical reflux symptoms well controlled.  Positive abdominal pain.  No dysphagia.  May be dealing with underlying gastroparesis.  Complains of significant leg cramps.  Gastric emptying study - chronic nausea and intermittant vomiting  GERD information provided  No change in medications for now  CBC, Chem-12 today  Further recommendations to follow     Notice: This dictation was prepared with Dragon dictation along with smaller phrase technology. Any transcriptional errors that result from this process are unintentional and may not be corrected upon review.

## 2017-11-19 ENCOUNTER — Telehealth: Payer: Self-pay

## 2017-11-19 NOTE — Telephone Encounter (Signed)
Gastric emptying study scheduled for 11/24/17 at 10:00am, arrive at 9:45am. NPO after midnight and no stomach medications. Called and informed pt of the appt. Letter mailed.

## 2017-11-19 NOTE — Telephone Encounter (Signed)
Christine Rogers at pre-service center called office. Pt needs PA for GES. GES approved via [employer] website. PA# [id number], 11/19/17-01/03/18. Called and informed Christine SackKendra PA was obtained.

## 2017-11-24 ENCOUNTER — Encounter (HOSPITAL_COMMUNITY): Payer: Medicare Other

## 2017-12-02 ENCOUNTER — Encounter (HOSPITAL_COMMUNITY): Payer: Self-pay

## 2017-12-02 ENCOUNTER — Other Ambulatory Visit (HOSPITAL_COMMUNITY)
Admission: RE | Admit: 2017-12-02 | Discharge: 2017-12-02 | Disposition: A | Discharge status: Home / Self Care | Payer: Medicare Other | Source: Ambulatory Visit | Attending: Internal Medicine | Admitting: Internal Medicine

## 2017-12-02 ENCOUNTER — Encounter (HOSPITAL_COMMUNITY)
Admission: RE | Admit: 2017-12-02 | Discharge: 2017-12-02 | Disposition: A | Discharge status: Home / Self Care | Payer: Medicare Other | Source: Ambulatory Visit | Attending: Internal Medicine | Admitting: Internal Medicine

## 2017-12-02 DIAGNOSIS — R112 Nausea with vomiting, unspecified: Secondary | ICD-10-CM | POA: Insufficient documentation

## 2017-12-02 LAB — COMPREHENSIVE METABOLIC PANEL
ALK PHOS: 122 U/L (ref 38–126)
ALT: 6 U/L (ref 0–44)
AST: 18 U/L (ref 15–41)
Albumin: 3.3 g/dL — ABNORMAL LOW (ref 3.5–5.0)
Anion gap: 7 (ref 5–15)
BILIRUBIN TOTAL: 0.3 mg/dL (ref 0.3–1.2)
BUN: 7 mg/dL — AB (ref 8–23)
CALCIUM: 8.6 mg/dL — AB (ref 8.9–10.3)
CO2: 25 mmol/L (ref 22–32)
Chloride: 105 mmol/L (ref 98–111)
Creatinine, Ser: 0.92 mg/dL (ref 0.44–1.00)
GFR calc Af Amer: >60 mL/min (ref >60)
Glucose, Bld: 124 mg/dL — ABNORMAL HIGH (ref 70–99)
POTASSIUM: 3.8 mmol/L (ref 3.5–5.1)
Sodium: 137 mmol/L (ref 135–145)
TOTAL PROTEIN: 6.6 g/dL (ref 6.5–8.1)

## 2017-12-02 LAB — CBC WITH DIFFERENTIAL/PLATELET
Abs Immature Granulocytes: 0.04 10*3/uL (ref 0.00–0.07)
Basophils Absolute: 0.1 10*3/uL (ref 0.0–0.1)
Basophils Relative: 1 %
EOS ABS: 0.4 10*3/uL (ref 0.0–0.5)
EOS PCT: 5 %
HCT: 38.3 % (ref 36.0–46.0)
HEMOGLOBIN: 12.2 g/dL (ref 12.0–15.0)
Immature Granulocytes: 1 %
Lymphocytes Relative: 19 %
Lymphs Abs: 1.5 10*3/uL (ref 0.7–4.0)
MCH: 30.9 pg (ref 26.0–34.0)
MCHC: 31.9 g/dL (ref 30.0–36.0)
MCV: 97 fL (ref 80.0–100.0)
MONO ABS: 0.7 10*3/uL (ref 0.1–1.0)
MONOS PCT: 8 %
NEUTROS ABS: 5.4 10*3/uL (ref 1.7–7.7)
Neutrophils Relative %: 66 %
PLATELETS: 247 10*3/uL (ref 150–400)
RBC: 3.95 MIL/uL (ref 3.87–5.11)
RDW: 14.1 % (ref 11.5–15.5)
WBC: 8.2 10*3/uL (ref 4.0–10.5)
nRBC: 0 % (ref 0.0–0.2)

## 2017-12-02 MED ORDER — TECHNETIUM TC 99M SULFUR COLLOID
2.0000 | Freq: Once | INTRAVENOUS | Status: AC | PRN
  Administered 2017-12-02: 1.9 via INTRAVENOUS

## 2017-12-17 ENCOUNTER — Ambulatory Visit (INDEPENDENT_AMBULATORY_CARE_PROVIDER_SITE_OTHER): Payer: Medicare Other | Admitting: Gastroenterology

## 2017-12-17 ENCOUNTER — Encounter: Payer: Self-pay | Admitting: Gastroenterology

## 2017-12-17 VITALS — BP 153/79 | HR 81 | Temp 98.3°F | Ht 61.0 in | Wt 106.0 lb

## 2017-12-17 DIAGNOSIS — R112 Nausea with vomiting, unspecified: Secondary | ICD-10-CM

## 2017-12-17 DIAGNOSIS — K838 Other specified diseases of biliary tract: Secondary | ICD-10-CM | POA: Insufficient documentation

## 2017-12-17 NOTE — Patient Instructions (Signed)
No PA needed for MRI abd per [facility]. Per [employer] website pt doesn't require PA for radiology test for Medicaid.

## 2017-12-17 NOTE — Progress Notes (Signed)
Referring Provider: Samuel JesterButler, Cynthia, DO Primary Care Physician:  Samuel JesterButler, Cynthia, DO  Primary GI: Dr. Jena Gaussourk   Chief Complaint  Patient presents with  . Nausea    no vomiting. Takes 3-4 zofrans daily but still has a lot of nausea    HPI:   Christine Rogers is a 77 y.o. female presenting today with a history of chronic nausea, no vomiting, GES normal. Last EGD in 2017.   Denies abdominal pain. No recent vomiting but has had episodes of unexpected vomiting. Nausea for 3 years. She had lost weight earlier this year but now gained back to baseline. GERD controlled with Dexilant. Occasional difficulty with pills and liquids but no solid food dysphagia. Appetite is coming back but still with early satiety. Remeron has helped with appetite. Takes BC powders as needed, but knows this is not recommended.   Notes bilateral lower extremity symptoms consistent with likely neuropathy. To see her PCP in the next few weeks. Nausea is persistent and affecting quality of life. She notes that she may need something to help with depression. Over past 10 years has lost many family members including mother and daughter. Father died in his 10950s. She is tearful when discussing this. Lives with oldest daughter and youngest son, which has helped her. Feels like chronic back pain and stress are contributing to her symptoms.   CT abd/pelvis last year with intra and extrahepatic biliary dilatation (up to 1 cm). Chronic. She is s/p cholecystectomy. LFTs normal. No MRCP. Chronic narcotics for back pain.   Past Medical History:  Diagnosis Date  . Acute pulmonary embolism (HCC) 10/19/2015  . Bipolar 1 disorder (HCC)   . COPD (chronic obstructive pulmonary disease) (HCC)   . DDD (degenerative disc disease), lumbar   . Depression   . Emphysema of lung (HCC)   . GERD (gastroesophageal reflux disease)   . High cholesterol   . History of kidney stones   . Hypertension   . Sleep apnea    cannot tolerate CPAP    Past  Surgical History:  Procedure Laterality Date  . ABDOMINAL HYSTERECTOMY    . BACK SURGERY     lumbar fusion  . BIOPSY  01/23/2015   Procedure: BIOPSY;  Surgeon: Corbin Adeobert M Rourk, MD;  [facility];  Service: Endoscopy;;  gastric biopsies  . CATARACT EXTRACTION, BILATERAL Bilateral   . CHOLECYSTECTOMY N/A 12/25/2016   Procedure: LAPAROSCOPIC CHOLECYSTECTOMY;  Surgeon: Franky MachoJenkins, Mark, MD;  Location: [facility];  Service: General;  Laterality: N/A;  . ESOPHAGOGASTRODUODENOSCOPY (EGD) WITH PROPOFOL N/A 01/23/2015   RMR: abnormal gastric mucosa.pyloric channel of uncertain significance status post biopys  . TONSILLECTOMY      Current Outpatient Medications  Medication Sig Dispense Refill  . Aspirin-Salicylamide-Caffeine (BC HEADACHE POWDER PO) Take 1 packet by mouth as needed (for pain or headache).    . clonazePAM (KLONOPIN) 0.5 MG tablet Take 0.5 mg by mouth at bedtime as needed for anxiety.     . DEXILANT 60 MG capsule TAKE ONE (1) CAPSULE EACH DAY 90 capsule 3  . linaclotide (LINZESS) 145 MCG CAPS capsule Take 145 mcg by mouth as needed.     Marland Kitchen. lisinopril (PRINIVIL,ZESTRIL) 2.5 MG tablet Take 2.5 mg by mouth 2 (two) times daily.     . mirtazapine (REMERON) 15 MG tablet Take 15 mg by mouth at bedtime.    . ondansetron (ZOFRAN) 8 MG tablet Take 8 mg by mouth 2 (two) times daily as needed.     .Marland Kitchen  ondansetron (ZOFRAN-ODT) 4 MG disintegrating tablet Take 4 mg by mouth every 8 (eight) hours as needed for nausea or vomiting.     Marland Kitchen oxyCODONE (OXYCONTIN) 40 mg 12 hr tablet Take 40 mg by mouth every 12 (twelve) hours.     . Oxycodone HCl 20 MG TABS Take 20 mg by mouth every 12 (twelve) hours.     . promethazine (PHENERGAN) 25 MG suppository Place 25 mg rectally at bedtime as needed for nausea or vomiting.    . sucralfate (CARAFATE) 1 GM/10ML suspension Take 1 g by mouth as needed.      No current facility-administered medications for this visit.     Allergies as of 12/17/2017 - Review Complete  12/17/2017  Allergen Reaction Noted  . Imitrex [sumatriptan] Anaphylaxis and Swelling 04/10/2014  . Ciprofloxacin Other (See Comments)   . Fentanyl Other (See Comments) 11/05/2016  . Demerol [meperidine] Nausea And Vomiting 05/30/2015    Family History  Problem Relation Age of Onset  . Heart disease Father   . Heart attack Father   . Heart attack Brother   . Heart Problems Brother   . Heart attack Brother   . Cancer Child   . Cerebral palsy Daughter   . Colon cancer Neg Hx     Social History   Socioeconomic History  . Marital status: Divorced    Spouse name: Not on file  . Number of children: Not on file  . Years of education: Not on file  . Highest education level: Not on file  Occupational History  . Not on file  Social Needs  . Financial resource strain: Not on file  . Food insecurity:    Worry: Not on file    Inability: Not on file  . Transportation needs:    Medical: Not on file    Non-medical: Not on file  Tobacco Use  . Smoking status: Current Some Day Smoker    Packs/day: 1.00    Years: 50.00    Pack years: 50.00    Types: Cigarettes    Start date: 01/30/1963  . Smokeless tobacco: Never Used  Substance and Sexual Activity  . Alcohol use: No  . Drug use: No  . Sexual activity: Not Currently    Birth control/protection: Surgical  Lifestyle  . Physical activity:    Days per week: Not on file    Minutes per session: Not on file  . Stress: Not on file  Relationships  . Social connections:    Talks on phone: Not on file    Gets together: Not on file    Attends religious service: Not on file    Active member of club or organization: Not on file    Attends meetings of clubs or organizations: Not on file    Relationship status: Not on file  Other Topics Concern  . Not on file  Social History Narrative  . Not on file    Review of Systems: Gen: Denies fever, chills, anorexia. Denies fatigue, weakness, weight loss.  CV: Denies chest pain,  palpitations, syncope, peripheral edema, and claudication. Resp: Denies dyspnea at rest, cough, wheezing, coughing up blood, and pleurisy. GI: see HPI  Derm: Denies rash, itching, dry skin Psych: see HPI Heme: Denies bruising, bleeding, and enlarged lymph nodes.  Physical Exam: BP (!) 153/79   Pulse 81   Temp 98.3 F (36.8 C) (Oral)   Ht 5\' 1"  (1.549 m)   Wt 106 lb (48.1 kg)   BMI 20.03 kg/m  General:   Alert and oriented. Tearful at times.  Head:  Normocephalic and atraumatic. Eyes:  Conjuctiva clear without scleral icterus. Mouth:  Oral mucosa pink and moist.  Abdomen:  +BS, soft, non-tender and non-distended. No rebound or guarding. No HSM or masses noted. Msk:  Kyphosis  Extremities:  Without edema. Neurologic:  Alert and  oriented x4 Psych:  Alert and cooperative. Normal mood and affect.  Lab Results  Component Value Date   WBC 8.2 12/02/2017   HGB 12.2 12/02/2017   HCT 38.3 12/02/2017   MCV 97.0 12/02/2017   PLT 247 12/02/2017   Lab Results  Component Value Date   ALT 6 12/02/2017   AST 18 12/02/2017   ALKPHOS 122 12/02/2017   BILITOT 0.3 12/02/2017   Lab Results  Component Value Date   CREATININE 0.92 12/02/2017   BUN 7 (L) 12/02/2017   NA 137 12/02/2017   K 3.8 12/02/2017   CL 105 12/02/2017   CO2 25 12/02/2017

## 2017-12-17 NOTE — Patient Instructions (Signed)
We have arranged an MRI to better look at your bile ducts.   If this is negative, we will pursue an upper endoscopy with Dr. Jena Gaussourk.  Further recommendations to follow!  Have a special Christmas!  It was a pleasure to see you today. I strive to create trusting relationships with patients to provide genuine, compassionate, and quality care. I value your feedback. If you receive a survey regarding your visit,  I greatly appreciate you taking time to fill this out.   Christine MinkAnna W. Breane Grunwald, PhD, ANP-BC [facility]

## 2017-12-19 IMAGING — NM NM HEPATOBILIARY IMAGE, INC GB
3 series · 13 of 13 positions shown · non-contrast
Comparison: None

CLINICAL DATA: Upper abdominal pain, nausea and pain for
months, coughing and uses vomiting

EXAM:
NUCLEAR MEDICINE HEPATOBILIARY IMAGING WITH GALLBLADDER EF
TECHNIQUE: Sequential images of the abdomen were obtained [DATE] minutes
following intravenous administration of radiopharmaceutical. After
oral ingestion of Ensure, gallbladder ejection fraction was
determined. At 60 min, normal ejection fraction is greater than 33%.
RADIOPHARMACEUTICALS:  5.2 mCi Mc-ZZm  Choletec IV

[Series 1: biliary · 3.25mm/px · 6 of 60 frames shown]
[frame 6/60]
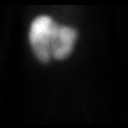
[frame 16/60]
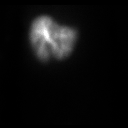
[frame 26/60]
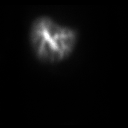
[frame 36/60]
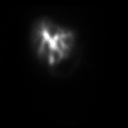
[frame 46/60]
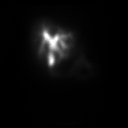
[frame 56/60]
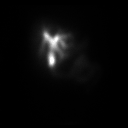

[Series 2: rt lat · 3.25mm/px · 1 of 1 slices shown]
[im 1/1]
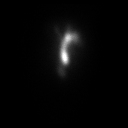

[Series 3: gbef · 3.25mm/px · 6 of 60 frames shown]
[frame 6/60]
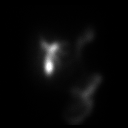
[frame 16/60]
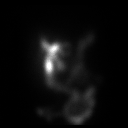
[frame 26/60]
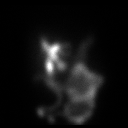
[frame 36/60]
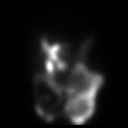
[frame 46/60]
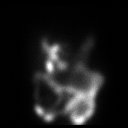
[frame 56/60]
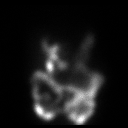

[13 of 13 positions shown; findings below may reference images not displayed]

FINDINGS: Normal tracer extraction from bloodstream indicating normal
hepatocellular function.

Normal excretion of tracer into biliary tree.

Gallbladder visualized at 29 min.

Small bowel visualized at 32 min.

No hepatic retention of tracer.

Subjectively normal emptying of tracer from gallbladder following
fatty meal stimulation.

Calculated gallbladder ejection fraction is 68%, normal.

Patient reported no symptoms following Ensure ingestion.

Normal gallbladder ejection fraction following Ensure ingestion is
greater than 33% at 1 hour.
IMPRESSION: Normal exam.

## 2017-12-23 NOTE — Assessment & Plan Note (Signed)
77 year old female with chronic nausea and vomiting, GES normal,last EGD in 2017. [facility]. GERD symptoms well controlled on Dexilant. No solid food dysphagia, but she has liquid and pill dysphagia at times. Affecting quality of life. In further discussion, she notes significant stress and anxiety along with depression. Feels like chronic back pain and these psychological factors are worsening her symptoms, which is highly likely. Chronic dilated bile duct, with normal LFTs. Doubt occult etiology. Will pursue MRCP just to have on file, and if negative as I suspect, will pursue EGD. Recommend more aggressive management of psychological factors with PCP and therapist if at all possible.   MRI/MRCP Possible EGD in near future Continue Dexilant, Zofran, Phenergan prn Further recommendations to follow

## 2017-12-23 NOTE — Progress Notes (Signed)
cc'ed to pcp °

## 2017-12-23 NOTE — Assessment & Plan Note (Signed)
With normal LFTs. Chronic finding. No MRCP on file. Unable to exclude opioids contributing to this. Further imaging as planned.

## 2017-12-24 ENCOUNTER — Other Ambulatory Visit: Payer: Self-pay | Admitting: Gastroenterology

## 2017-12-24 ENCOUNTER — Ambulatory Visit (HOSPITAL_COMMUNITY)
Admission: RE | Admit: 2017-12-24 | Discharge: 2017-12-24 | Disposition: A | Discharge status: Home / Self Care | Payer: Medicare Other | Source: Ambulatory Visit | Attending: Gastroenterology | Admitting: Gastroenterology

## 2017-12-24 DIAGNOSIS — Z9049 Acquired absence of other specified parts of digestive tract: Secondary | ICD-10-CM | POA: Insufficient documentation

## 2017-12-24 DIAGNOSIS — K838 Other specified diseases of biliary tract: Secondary | ICD-10-CM | POA: Diagnosis present

## 2017-12-24 MED ORDER — GADOBUTROL 1 MMOL/ML IV SOLN
5.0000 mL | Freq: Once | INTRAVENOUS | Status: AC | PRN
  Administered 2017-12-24: 5 mL via INTRAVENOUS

## 2017-12-29 NOTE — Progress Notes (Signed)
MRI/MRCP reviewed. No mass lesion or choledocholithiasis. She needs an EGD with possible dilation by Dr. Jena Gaussourk with Propofol.

## 2017-12-30 ENCOUNTER — Other Ambulatory Visit: Payer: Self-pay | Admitting: *Deleted

## 2017-12-30 ENCOUNTER — Encounter: Payer: Self-pay | Admitting: *Deleted

## 2017-12-30 DIAGNOSIS — R110 Nausea: Secondary | ICD-10-CM

## 2017-12-30 DIAGNOSIS — R112 Nausea with vomiting, unspecified: Secondary | ICD-10-CM

## 2017-12-30 DIAGNOSIS — G8929 Other chronic pain: Secondary | ICD-10-CM

## 2017-12-30 DIAGNOSIS — R1011 Right upper quadrant pain: Secondary | ICD-10-CM

## 2017-12-30 NOTE — Progress Notes (Signed)
PT is aware and OK to schedule EGD.

## 2017-12-30 NOTE — Progress Notes (Signed)
LMOM to call.

## 2018-01-29 ENCOUNTER — Encounter (HOSPITAL_COMMUNITY): Payer: Self-pay

## 2018-01-30 ENCOUNTER — Ambulatory Visit (HOSPITAL_COMMUNITY)
Admission: RE | Admit: 2018-01-30 | Discharge: 2018-01-30 | Disposition: A | Discharge status: Home / Self Care | Payer: Medicare Other | Source: Ambulatory Visit | Attending: Internal Medicine | Admitting: Internal Medicine

## 2018-02-03 ENCOUNTER — Telehealth: Payer: Self-pay | Admitting: Internal Medicine

## 2018-02-03 NOTE — Telephone Encounter (Signed)
Pt is scheduled procedure with RMR on 02/05/2018 and wants to reschedule. Please call her at [phone]

## 2018-02-03 NOTE — Telephone Encounter (Signed)
Patient states she has a pinched nerve in foot and back problems right now. She is going to see the doctor again in February. She is going to call back and r/s once she has this issue resolved. Called endo and spoke with kim, procedure cancelled. FYI to AB

## 2018-02-05 ENCOUNTER — Ambulatory Visit (HOSPITAL_COMMUNITY): Admission: RE | Admit: 2018-02-05 | Payer: Medicare Other | Source: Home / Self Care | Admitting: Internal Medicine

## 2018-02-05 ENCOUNTER — Encounter (HOSPITAL_COMMUNITY): Admission: RE | Payer: Self-pay | Source: Home / Self Care

## 2018-02-05 SURGERY — ESOPHAGOGASTRODUODENOSCOPY (EGD) WITH PROPOFOL
Anesthesia: Monitor Anesthesia Care

## 2018-03-11 ENCOUNTER — Other Ambulatory Visit: Payer: Self-pay | Admitting: Gastroenterology

## 2018-06-26 ENCOUNTER — Other Ambulatory Visit (HOSPITAL_COMMUNITY): Payer: Self-pay | Admitting: *Deleted

## 2018-06-26 ENCOUNTER — Ambulatory Visit (HOSPITAL_COMMUNITY)
Admission: RE | Admit: 2018-06-26 | Discharge: 2018-06-26 | Disposition: A | Discharge status: Home / Self Care | Payer: Medicare Other | Source: Ambulatory Visit | Attending: *Deleted | Admitting: *Deleted

## 2018-06-26 ENCOUNTER — Other Ambulatory Visit: Payer: Self-pay

## 2018-06-26 DIAGNOSIS — M5136 Other intervertebral disc degeneration, lumbar region: Secondary | ICD-10-CM

## 2018-06-26 DIAGNOSIS — M4800 Spinal stenosis, site unspecified: Secondary | ICD-10-CM | POA: Insufficient documentation

## 2018-07-08 DIAGNOSIS — G2581 Restless legs syndrome: Secondary | ICD-10-CM | POA: Diagnosis not present

## 2018-07-08 DIAGNOSIS — G8929 Other chronic pain: Secondary | ICD-10-CM | POA: Diagnosis not present

## 2018-07-08 DIAGNOSIS — I10 Essential (primary) hypertension: Secondary | ICD-10-CM | POA: Diagnosis not present

## 2018-07-23 DIAGNOSIS — E785 Hyperlipidemia, unspecified: Secondary | ICD-10-CM | POA: Diagnosis not present

## 2018-07-23 DIAGNOSIS — G8929 Other chronic pain: Secondary | ICD-10-CM | POA: Diagnosis not present

## 2018-07-23 DIAGNOSIS — I10 Essential (primary) hypertension: Secondary | ICD-10-CM | POA: Diagnosis not present

## 2018-07-23 DIAGNOSIS — Z7689 Persons encountering health services in other specified circumstances: Secondary | ICD-10-CM | POA: Diagnosis not present

## 2018-07-23 DIAGNOSIS — E559 Vitamin D deficiency, unspecified: Secondary | ICD-10-CM | POA: Diagnosis not present

## 2018-08-04 DIAGNOSIS — E785 Hyperlipidemia, unspecified: Secondary | ICD-10-CM | POA: Diagnosis not present

## 2018-08-04 DIAGNOSIS — I10 Essential (primary) hypertension: Secondary | ICD-10-CM | POA: Diagnosis not present

## 2018-08-04 DIAGNOSIS — E782 Mixed hyperlipidemia: Secondary | ICD-10-CM | POA: Diagnosis not present

## 2018-08-04 DIAGNOSIS — Z79899 Other long term (current) drug therapy: Secondary | ICD-10-CM | POA: Diagnosis not present

## 2018-08-04 DIAGNOSIS — G459 Transient cerebral ischemic attack, unspecified: Secondary | ICD-10-CM | POA: Diagnosis not present

## 2018-08-04 DIAGNOSIS — M545 Low back pain: Secondary | ICD-10-CM | POA: Diagnosis not present

## 2018-08-10 DIAGNOSIS — G458 Other transient cerebral ischemic attacks and related syndromes: Secondary | ICD-10-CM | POA: Diagnosis not present

## 2018-08-10 DIAGNOSIS — I6523 Occlusion and stenosis of bilateral carotid arteries: Secondary | ICD-10-CM | POA: Diagnosis not present

## 2018-09-17 DIAGNOSIS — R5383 Other fatigue: Secondary | ICD-10-CM | POA: Diagnosis not present

## 2018-09-17 DIAGNOSIS — Z1159 Encounter for screening for other viral diseases: Secondary | ICD-10-CM | POA: Diagnosis not present

## 2018-09-17 DIAGNOSIS — M5442 Lumbago with sciatica, left side: Secondary | ICD-10-CM | POA: Diagnosis not present

## 2018-09-17 DIAGNOSIS — Z0000 Encounter for general adult medical examination without abnormal findings: Secondary | ICD-10-CM | POA: Diagnosis not present

## 2018-09-17 DIAGNOSIS — M79604 Pain in right leg: Secondary | ICD-10-CM | POA: Diagnosis not present

## 2018-09-17 DIAGNOSIS — M129 Arthropathy, unspecified: Secondary | ICD-10-CM | POA: Diagnosis not present

## 2018-09-17 DIAGNOSIS — E7800 Pure hypercholesterolemia, unspecified: Secondary | ICD-10-CM | POA: Diagnosis not present

## 2018-09-17 DIAGNOSIS — Z79899 Other long term (current) drug therapy: Secondary | ICD-10-CM | POA: Diagnosis not present

## 2018-09-17 DIAGNOSIS — E559 Vitamin D deficiency, unspecified: Secondary | ICD-10-CM | POA: Diagnosis not present

## 2018-09-17 DIAGNOSIS — M79605 Pain in left leg: Secondary | ICD-10-CM | POA: Diagnosis not present

## 2018-09-26 DIAGNOSIS — Z1211 Encounter for screening for malignant neoplasm of colon: Secondary | ICD-10-CM | POA: Diagnosis not present

## 2018-09-26 DIAGNOSIS — K219 Gastro-esophageal reflux disease without esophagitis: Secondary | ICD-10-CM | POA: Diagnosis not present

## 2018-10-06 DIAGNOSIS — R110 Nausea: Secondary | ICD-10-CM | POA: Diagnosis not present

## 2018-10-12 DIAGNOSIS — M1612 Unilateral primary osteoarthritis, left hip: Secondary | ICD-10-CM | POA: Diagnosis not present

## 2018-10-12 DIAGNOSIS — J439 Emphysema, unspecified: Secondary | ICD-10-CM | POA: Diagnosis not present

## 2018-10-26 DIAGNOSIS — E7800 Pure hypercholesterolemia, unspecified: Secondary | ICD-10-CM | POA: Diagnosis not present

## 2018-10-26 DIAGNOSIS — M1612 Unilateral primary osteoarthritis, left hip: Secondary | ICD-10-CM | POA: Diagnosis not present

## 2018-10-26 DIAGNOSIS — R7303 Prediabetes: Secondary | ICD-10-CM | POA: Diagnosis not present

## 2018-11-05 DIAGNOSIS — E7849 Other hyperlipidemia: Secondary | ICD-10-CM | POA: Diagnosis not present

## 2018-11-05 DIAGNOSIS — Z1389 Encounter for screening for other disorder: Secondary | ICD-10-CM | POA: Diagnosis not present

## 2018-11-05 DIAGNOSIS — Z0000 Encounter for general adult medical examination without abnormal findings: Secondary | ICD-10-CM | POA: Diagnosis not present

## 2018-11-05 DIAGNOSIS — I10 Essential (primary) hypertension: Secondary | ICD-10-CM | POA: Diagnosis not present

## 2018-11-05 DIAGNOSIS — M545 Low back pain: Secondary | ICD-10-CM | POA: Diagnosis not present

## 2018-11-05 DIAGNOSIS — G459 Transient cerebral ischemic attack, unspecified: Secondary | ICD-10-CM | POA: Diagnosis not present

## 2018-11-21 DIAGNOSIS — Z981 Arthrodesis status: Secondary | ICD-10-CM | POA: Diagnosis not present

## 2018-11-21 DIAGNOSIS — I110 Hypertensive heart disease with heart failure: Secondary | ICD-10-CM | POA: Diagnosis not present

## 2018-11-21 DIAGNOSIS — K219 Gastro-esophageal reflux disease without esophagitis: Secondary | ICD-10-CM | POA: Diagnosis not present

## 2018-11-21 DIAGNOSIS — I700 Atherosclerosis of aorta: Secondary | ICD-10-CM | POA: Diagnosis not present

## 2018-11-21 DIAGNOSIS — R7989 Other specified abnormal findings of blood chemistry: Secondary | ICD-10-CM | POA: Diagnosis not present

## 2018-11-21 DIAGNOSIS — G8929 Other chronic pain: Secondary | ICD-10-CM | POA: Diagnosis not present

## 2018-11-21 DIAGNOSIS — I2699 Other pulmonary embolism without acute cor pulmonale: Secondary | ICD-10-CM | POA: Diagnosis not present

## 2018-11-21 DIAGNOSIS — Z7982 Long term (current) use of aspirin: Secondary | ICD-10-CM | POA: Diagnosis not present

## 2018-11-21 DIAGNOSIS — Z9049 Acquired absence of other specified parts of digestive tract: Secondary | ICD-10-CM | POA: Diagnosis not present

## 2018-11-21 DIAGNOSIS — I5032 Chronic diastolic (congestive) heart failure: Secondary | ICD-10-CM | POA: Diagnosis not present

## 2018-11-21 DIAGNOSIS — M545 Low back pain: Secondary | ICD-10-CM | POA: Diagnosis not present

## 2018-11-21 DIAGNOSIS — R0602 Shortness of breath: Secondary | ICD-10-CM | POA: Diagnosis not present

## 2018-11-21 DIAGNOSIS — Z1159 Encounter for screening for other viral diseases: Secondary | ICD-10-CM | POA: Diagnosis not present

## 2018-11-21 DIAGNOSIS — R0902 Hypoxemia: Secondary | ICD-10-CM | POA: Diagnosis not present

## 2018-11-21 DIAGNOSIS — J449 Chronic obstructive pulmonary disease, unspecified: Secondary | ICD-10-CM | POA: Diagnosis not present

## 2018-11-21 DIAGNOSIS — E785 Hyperlipidemia, unspecified: Secondary | ICD-10-CM | POA: Diagnosis not present

## 2018-12-02 DIAGNOSIS — M545 Low back pain: Secondary | ICD-10-CM | POA: Diagnosis not present

## 2018-12-02 DIAGNOSIS — I2699 Other pulmonary embolism without acute cor pulmonale: Secondary | ICD-10-CM | POA: Diagnosis not present

## 2018-12-02 DIAGNOSIS — Z6824 Body mass index (BMI) 24.0-24.9, adult: Secondary | ICD-10-CM | POA: Diagnosis not present

## 2018-12-09 DIAGNOSIS — R0602 Shortness of breath: Secondary | ICD-10-CM | POA: Diagnosis not present

## 2018-12-09 DIAGNOSIS — Z01810 Encounter for preprocedural cardiovascular examination: Secondary | ICD-10-CM | POA: Diagnosis not present

## 2018-12-09 DIAGNOSIS — I2699 Other pulmonary embolism without acute cor pulmonale: Secondary | ICD-10-CM | POA: Diagnosis not present

## 2018-12-30 DIAGNOSIS — M545 Low back pain: Secondary | ICD-10-CM | POA: Diagnosis not present

## 2018-12-30 DIAGNOSIS — J449 Chronic obstructive pulmonary disease, unspecified: Secondary | ICD-10-CM | POA: Diagnosis not present

## 2018-12-30 DIAGNOSIS — E7849 Other hyperlipidemia: Secondary | ICD-10-CM | POA: Diagnosis not present

## 2018-12-30 DIAGNOSIS — I2699 Other pulmonary embolism without acute cor pulmonale: Secondary | ICD-10-CM | POA: Diagnosis not present

## 2019-02-02 DIAGNOSIS — M25512 Pain in left shoulder: Secondary | ICD-10-CM | POA: Diagnosis not present

## 2019-02-03 DIAGNOSIS — G8929 Other chronic pain: Secondary | ICD-10-CM | POA: Diagnosis not present

## 2019-02-16 DIAGNOSIS — M1612 Unilateral primary osteoarthritis, left hip: Secondary | ICD-10-CM | POA: Diagnosis not present

## 2019-02-16 DIAGNOSIS — M5136 Other intervertebral disc degeneration, lumbar region: Secondary | ICD-10-CM | POA: Diagnosis not present

## 2019-03-22 DIAGNOSIS — R110 Nausea: Secondary | ICD-10-CM | POA: Diagnosis not present

## 2019-03-23 ENCOUNTER — Encounter: Payer: Self-pay | Admitting: Internal Medicine

## 2019-03-26 ENCOUNTER — Other Ambulatory Visit: Payer: Self-pay

## 2019-03-26 NOTE — Patient Outreach (Signed)
[facility]) Care Management  03/26/2019  Christine Rogers [birth year] [id number]   Medication Adherence call to Mrs. [name],patient did not want to engage,patient hung up. Mrs. [name] is showing past due on Atorvastatin 20 mg under [facility] Ins.   Lillia Abed CPhT Pharmacy Technician [facility] Care Management Direct Dial [phone]  Fax [phone] [email or web address]

## 2019-04-07 ENCOUNTER — Ambulatory Visit: Payer: Medicare Other | Admitting: Gastroenterology

## 2019-05-24 DIAGNOSIS — R110 Nausea: Secondary | ICD-10-CM | POA: Diagnosis not present

## 2019-06-03 DIAGNOSIS — R110 Nausea: Secondary | ICD-10-CM | POA: Diagnosis not present

## 2019-06-03 DIAGNOSIS — I10 Essential (primary) hypertension: Secondary | ICD-10-CM | POA: Diagnosis not present

## 2019-06-22 DIAGNOSIS — I10 Essential (primary) hypertension: Secondary | ICD-10-CM | POA: Diagnosis not present

## 2019-06-22 DIAGNOSIS — D72829 Elevated white blood cell count, unspecified: Secondary | ICD-10-CM | POA: Diagnosis not present

## 2019-06-24 DIAGNOSIS — M1612 Unilateral primary osteoarthritis, left hip: Secondary | ICD-10-CM | POA: Diagnosis not present

## 2019-07-13 DIAGNOSIS — R110 Nausea: Secondary | ICD-10-CM | POA: Diagnosis not present

## 2019-07-22 ENCOUNTER — Telehealth: Payer: Self-pay | Admitting: Emergency Medicine

## 2019-07-22 ENCOUNTER — Encounter: Payer: Self-pay | Admitting: Nurse Practitioner

## 2019-07-22 ENCOUNTER — Other Ambulatory Visit: Payer: Self-pay

## 2019-07-22 ENCOUNTER — Encounter: Payer: Self-pay | Admitting: Emergency Medicine

## 2019-07-22 ENCOUNTER — Ambulatory Visit (INDEPENDENT_AMBULATORY_CARE_PROVIDER_SITE_OTHER): Payer: Medicare Other | Admitting: Nurse Practitioner

## 2019-07-22 VITALS — BP 162/88 | HR 88 | Temp 97.3°F | Ht 61.0 in | Wt 120.8 lb

## 2019-07-22 DIAGNOSIS — R109 Unspecified abdominal pain: Secondary | ICD-10-CM

## 2019-07-22 DIAGNOSIS — R112 Nausea with vomiting, unspecified: Secondary | ICD-10-CM

## 2019-07-22 DIAGNOSIS — K5909 Other constipation: Secondary | ICD-10-CM | POA: Diagnosis not present

## 2019-07-22 DIAGNOSIS — R1010 Upper abdominal pain, unspecified: Secondary | ICD-10-CM | POA: Insufficient documentation

## 2019-07-22 NOTE — Patient Instructions (Signed)
Your health issues we discussed today were:   Abdominal pain, GERD (reflux/heartburn) chronic nausea and occasional vomiting: 1. Continue taking Carafate as you have been 2. Continue taking Zofran and Phenergan to try to help with your nausea 3. We will schedule an upper endoscopy to further evaluate for causes of your nausea and abdominal pain 4. As we discussed, your MRI looked stable/good with no obvious causes of your symptoms 5. We will need to reach out to your primary care doctor to get clearance to hold your Eliquis for 2 days prior to your procedure.  Further recommendations will be communicated to you 6. Call if you have any worsening or severe symptoms  Constipation: 1. Advised constipation is doing better on Linzess 2. Continue to take Linzess as you have been 3. Let us know if you have any worsening or severe symptoms  Overall I recommend:  1. Continue your other current medications 2. Return for follow-up in 2 months 3. Call us for any questions or concerns   At [facility] we value your feedback. You may receive a survey about your visit today. Please share your experience as we strive to create trusting relationships with our patients to provide genuine, compassionate, quality care.  We appreciate your understanding and patience as we review any laboratory studies, imaging, and other diagnostic tests that are ordered as we care for you. Our office policy is 5 business days for review of these results, and any emergent or urgent results are addressed in a timely manner for your best interest. If you do not hear from our office in 1 week, please contact us.   We also encourage the use of MyChart, which contains your medical information for your review as well. If you are not enrolled in this feature, an access code is on this after visit summary for your convenience. Thank you for allowing [place] to be involved in your care.  It was great to see you today!  I hope  you have a great Summer!!

## 2019-07-22 NOTE — Telephone Encounter (Signed)
error 

## 2019-07-22 NOTE — Assessment & Plan Note (Signed)
Persistent nausea and vomiting for the past 5 years.  Seems to be somewhat worse recently.  Previous EGD in 2017 essentially unremarkable.  Constipation now well managed.  GERD doing better on Carafate than it was on Dexilant.  Could be an element of GERD/gastritis/esophagitis at play and we will plan for an upper endoscopy to further evaluate due to her persistent nausea and occasional vomiting.  Previous dilated intra and extrahepatic biliary ducts which were reevaluated on MRI recently and found to be persistently dilated but with no obvious cause other than status post cholecystectomy.  No other findings on MRI to explain her symptoms.  This point we will proceed with an EGD to further evaluate.  Follow-up in months.  May consider gastric emptying study for possible gastroparesis given chronic medical conditions, although she is not diabetic.  Proceed with EGD with Dr. Gala Romney on propofol/MAC in near future: the risks, benefits, and alternatives have been discussed with the patient in detail. The patient states understanding and desires to proceed.  Patient has a chronic history of significant pain medications, although these were recently discontinued.  She is still on Klonopin, Remeron, Phenergan.  She is also on Eliquis.  We will reach out to whoever manages her Eliquis to request okay to hold for 48 hours prior to procedure.  We will plan for the procedure on propofol/MAC to promote adequate sedation.  ASA III (COPD, hypertension moderate control, history of PE > 3 months ago)

## 2019-07-22 NOTE — Assessment & Plan Note (Signed)
Constipation well managed on Linzess as needed.  Recommend to continue taking this and follow-up in 2 months.

## 2019-07-22 NOTE — Progress Notes (Signed)
Primary Care Physician:  Jason CoopNicholson, Sterling J IV, FNP Primary Gastroenterologist:  Dr. Jena Gaussourk  Chief Complaint  Patient presents with  . Nausea    chronic, ref by Dr. Harle StanfordNicholson, takes Zofran, Phenergan  . Gastroesophageal Reflux    carafate    HPI:   Christine Rogers is a 79 y.o. female who presents for follow-up on chronic nausea.  The patient was last seen in our office 12/17/2017 for nausea and vomiting, dilated bile duct.  GES on file and normal.  At her last visit noted no abdominal pain, no recent vomiting but has had previous episodes of unexpected vomiting.  Nausea for 3 years.  GERD well managed on Dexilant, no solid food dysphagia.  Appetite returning with aid of Remeron.  BC powders as needed and knows this is not recommended.  Nausea is persistent and affecting her quality of life, feels she may need something for depression.  Feels chronic stress is contributing to her symptoms.  CT abdomen and pelvis on file last year with intra and extrahepatic biliary ductal dilation up to 1 cm which is chronic, status post cholecystectomy with normal LFTs, no MRCP.  Chronic narcotics back pain.  Recommended MRI for further evaluation of bile ducts and if negative pursue EGD.  Previous EGD on file 01/23/2015 completed for nausea/vomiting, GERD.  Findings included abnormal gastric mucosa/pyloric channel of uncertain significance status post biopsy.  Surgical pathology found the biopsies to be mild chronic gastritis with very focal intestinal metaplasia, negative for H. pylori.  Recommended colonoscopy but previously declined.  MRI/MRCP completed 12/24/2017 which found similar appearance of intra and extrahepatic biliary duct dilation status post cholecystectomy without obstructing mass lesion and no MR evidence of choledocholithiasis; otherwise unremarkable exam.  Recommended EGD which was scheduled for 02/05/2018.   However, the patient called to cancel her endoscopy due to having a pinched nerve  in her foot.  She did not call back to reschedule.  Today she states she is doing well overall. She was taken off Dexilant by PCP on recommendation of insurance company pharmacist who warned of adverse effects. States she is having recurrent GERD but Dexilant didn't really help. Is taking Carafate which helps when she has a flare. Still with epigastric abdominal discomfort and bloating. Also with chronic pain in the RUQ. Chronic COPD but breathing is baseline. No palpitations or tachycardia; recent echo was good (per the patient). Does have rare vomiting. Denies hematochezia, melena, fever, chills, unintentional weight loss. Denies URI or flu-like symptoms. Denies loss of sense of taste or smell. The patient has not received COVID-19 vaccination(s). Denies chest pain, dyspnea, dizziness, lightheadedness, syncope, near syncope. Denies any other upper or lower GI symptoms. Was recently on a course of steroids and did great, but was told they cannot prescribe steroids chronically.  Having good bowel movement on Linzess prn. She is on Zofran and Phenergan which doesn't really help her nausea.  Past Medical History:  Diagnosis Date  . Acute pulmonary embolism (HCC) 10/19/2015  . Bipolar 1 disorder (HCC)   . COPD (chronic obstructive pulmonary disease) (HCC)   . DDD (degenerative disc disease), lumbar   . Depression   . Emphysema of lung (HCC)   . GERD (gastroesophageal reflux disease)   . High cholesterol   . History of kidney stones   . Hypertension   . Sleep apnea    cannot tolerate CPAP    Past Surgical History:  Procedure Laterality Date  . ABDOMINAL HYSTERECTOMY    .  BACK SURGERY     lumbar fusion  . BIOPSY  01/23/2015   Procedure: BIOPSY;  Surgeon: Corbin Ade, MD;  Location: [facility];  Service: Endoscopy;;  gastric biopsies  . CATARACT EXTRACTION, BILATERAL Bilateral   . CHOLECYSTECTOMY N/A 12/25/2016   Procedure: LAPAROSCOPIC CHOLECYSTECTOMY;  Surgeon: Franky Macho, MD;   Location: [facility];  Service: General;  Laterality: N/A;  . ESOPHAGOGASTRODUODENOSCOPY (EGD) WITH PROPOFOL N/A 01/23/2015   RMR: abnormal gastric mucosa.pyloric channel of uncertain significance status post biopys  . TONSILLECTOMY      Current Outpatient Medications  Medication Sig Dispense Refill  . apixaban (ELIQUIS) 5 MG TABS tablet Take 5 mg by mouth 2 (two) times daily.    Marland Kitchen aspirin EC 81 MG tablet Take 81 mg by mouth daily. Swallow whole.    . clonazePAM (KLONOPIN) 0.5 MG tablet Take 0.5 mg by mouth at bedtime as needed for anxiety.     Marland Kitchen linaclotide (LINZESS) 145 MCG CAPS capsule Take 145 mcg by mouth daily as needed (constipation).     Marland Kitchen lisinopril (PRINIVIL,ZESTRIL) 5 MG tablet Take 5 mg by mouth daily.     . mirtazapine (REMERON) 15 MG tablet Take 15 mg by mouth at bedtime.    . ondansetron (ZOFRAN) 8 MG tablet Take 8 mg by mouth 2 (two) times daily as needed.     . ondansetron (ZOFRAN-ODT) 4 MG disintegrating tablet Take 4 mg by mouth every 8 (eight) hours as needed for nausea or vomiting.     . promethazine (PHENERGAN) 25 MG suppository Place 25 mg rectally at bedtime as needed for nausea or vomiting.    . sucralfate (CARAFATE) 1 GM/10ML suspension Take 1 g by mouth 2 (two) times daily as needed (stomach pain).      No current facility-administered medications for this visit.    Allergies as of 07/22/2019 - Review Complete 07/22/2019  Allergen Reaction Noted  . Imitrex [sumatriptan] Anaphylaxis and Swelling 04/10/2014  . Ciprofloxacin Other (See Comments)   . Fentanyl Other (See Comments) 11/05/2016  . Gabapentin  07/22/2019  . Demerol [meperidine] Nausea And Vomiting 05/30/2015    Family History  Problem Relation Age of Onset  . Heart disease Father   . Heart attack Father   . Heart attack Brother   . Heart Problems Brother   . Heart attack Brother   . Cancer Child   . Cerebral palsy Daughter   . Colon cancer Neg Hx     Social History   Socioeconomic History  .  Marital status: Divorced    Spouse name: Not on file  . Number of children: Not on file  . Years of education: Not on file  . Highest education level: Not on file  Occupational History  . Not on file  Tobacco Use  . Smoking status: Current Some Day Smoker    Packs/day: 1.00    Years: 50.00    Pack years: 50.00    Types: Cigarettes    Start date: 01/30/1963  . Smokeless tobacco: Never Used  Vaping Use  . Vaping Use: Never used  Substance and Sexual Activity  . Alcohol use: No  . Drug use: No  . Sexual activity: Not Currently    Birth control/protection: Surgical  Other Topics Concern  . Not on file  Social History Narrative  . Not on file   Social Determinants of Health   Financial Resource Strain:   . Difficulty of Paying Living Expenses:   Food Insecurity:   .  Worried About Programme researcher, broadcasting/film/video in the Last Year:   . Barista in the Last Year:   Transportation Needs:   . Freight forwarder (Medical):   Marland Kitchen Lack of Transportation (Non-Medical):   Physical Activity:   . Days of Exercise per Week:   . Minutes of Exercise per Session:   Stress:   . Feeling of Stress :   Social Connections:   . Frequency of Communication with Friends and Family:   . Frequency of Social Gatherings with Friends and Family:   . Attends Religious Services:   . Active Member of Clubs or Organizations:   . Attends Banker Meetings:   Marland Kitchen Marital Status:   Intimate Partner Violence:   . Fear of Current or Ex-Partner:   . Emotionally Abused:   Marland Kitchen Physically Abused:   . Sexually Abused:     Subjective: Review of Systems  Constitutional: Negative for chills, fever, malaise/fatigue and weight loss.  HENT: Negative for congestion and sore throat.   Respiratory: Negative for cough and shortness of breath.   Cardiovascular: Negative for chest pain and palpitations.  Gastrointestinal: Positive for abdominal pain, heartburn, nausea and vomiting. Negative for blood in stool,  diarrhea and melena.  Musculoskeletal: Negative for joint pain and myalgias.  Skin: Negative for rash.  Neurological: Negative for dizziness and weakness.  Endo/Heme/Allergies: Does not bruise/bleed easily.  Psychiatric/Behavioral: Negative for depression. The patient is not nervous/anxious.   All other systems reviewed and are negative.      Objective: BP (!) 162/88   Pulse 88   Temp (!) 97.3 F (36.3 C) (Oral)   Ht 5\' 1"  (1.549 m)   Wt 120 lb 12.8 oz (54.8 kg)   BMI 22.82 kg/m  Physical Exam Vitals and nursing note reviewed.  Constitutional:      General: She is not in acute distress.    Appearance: Normal appearance. She is well-developed and normal weight. She is not ill-appearing, toxic-appearing or diaphoretic.  HENT:     Head: Normocephalic and atraumatic.     Nose: No congestion or rhinorrhea.  Eyes:     General: No scleral icterus. Cardiovascular:     Rate and Rhythm: Normal rate and regular rhythm.     Heart sounds: Normal heart sounds.  Pulmonary:     Effort: Pulmonary effort is normal. No respiratory distress.     Breath sounds: Normal breath sounds.  Abdominal:     General: Bowel sounds are normal.     Palpations: Abdomen is soft. There is no hepatomegaly, splenomegaly or mass.     Tenderness: There is no abdominal tenderness. There is no guarding or rebound.     Hernia: No hernia is present.  Skin:    General: Skin is warm and dry.     Coloration: Skin is not jaundiced.     Findings: No rash.  Neurological:     General: No focal deficit present.     Mental Status: She is alert and oriented to person, place, and time.  Psychiatric:        Attention and Perception: Attention normal.        Mood and Affect: Mood normal.        Speech: Speech normal.        Behavior: Behavior normal.        Thought Content: Thought content normal.        Cognition and Memory: Cognition and memory normal.  07/22/2019 1:49 PM   Disclaimer: This note was  dictated with voice recognition software. Similar sounding words can inadvertently be transcribed and may not be corrected upon review.

## 2019-07-22 NOTE — Telephone Encounter (Signed)
Clearance faxed to doctor hasanajs office fax [phone]

## 2019-07-22 NOTE — Assessment & Plan Note (Signed)
Patient is chronic abdominal pain, generally right upper quadrant and upper abdomen.  Was previously on Dexilant but this was discontinued by primary care on the advice of the insurance company pharmacist for adverse effects potential.  She was started on Carafate.  She states that Dexilant generally did not help much anyway.  Carafate does help when she has a flare.  At this point recommend she continue the Carafate and we will hold off on restarting PPI for now until we can get EGD results.  Previously recommended repeat EGD given normal MRI.  Overall she has had quite an extensive work-up without obvious etiology.  Hopefully EGD will shed some light on her situation.  Further recommendations to follow, follow-up in 2 months.

## 2019-07-29 ENCOUNTER — Telehealth: Payer: Self-pay | Admitting: Internal Medicine

## 2019-07-29 NOTE — Telephone Encounter (Signed)
Pt was seen on 7/15 by EG and wanted to know when she was going to be able to schedule her EGD.  [phone]

## 2019-07-29 NOTE — Telephone Encounter (Signed)
Awaiting clearance currently. Pt aware

## 2019-07-30 ENCOUNTER — Telehealth: Payer: Self-pay | Admitting: Internal Medicine

## 2019-07-30 NOTE — Telephone Encounter (Signed)
[phone] PLEASE CALL [facility] ABOUT STOPPING PATIENT ELOQUIS BEFORE HER PROCEDURE

## 2019-08-02 ENCOUNTER — Encounter: Payer: Self-pay | Admitting: Emergency Medicine

## 2019-08-02 NOTE — Telephone Encounter (Signed)
Spoke with Tobi Bastos from [facility]. Pts PCP is Dr. Halford Decamp and the fax is [phone]. Please fax letter to pts PCP.

## 2019-08-02 NOTE — Telephone Encounter (Signed)
Notified by doctor knowlton's office that doctor Lia Hopping is no longer the patients pcp. faxed over updated clearance to  [phone] for clearance

## 2019-08-03 ENCOUNTER — Telehealth: Payer: Self-pay | Admitting: Emergency Medicine

## 2019-08-03 ENCOUNTER — Encounter: Payer: Self-pay | Admitting: Emergency Medicine

## 2019-08-03 NOTE — Telephone Encounter (Signed)
We received medical clearance for pt's procedure. However, the note was not signed by the doctor knowlton. Notified eg. Are we able to use this?

## 2019-08-03 NOTE — Telephone Encounter (Signed)
noted 

## 2019-08-03 NOTE — Telephone Encounter (Signed)
Looks like pts pcp is now Dr.Sterling Harle Stanford ( see previous phone note). Please give the fax to Minerva Areola to review.

## 2019-08-04 ENCOUNTER — Telehealth: Payer: Self-pay | Admitting: Emergency Medicine

## 2019-08-04 ENCOUNTER — Telehealth: Payer: Self-pay | Admitting: Internal Medicine

## 2019-08-04 NOTE — Telephone Encounter (Signed)
Discussed with LL. She will call PCP office back, still waiting on clearance to hold her anticoagulant.

## 2019-08-04 NOTE — Telephone Encounter (Signed)
Faxed originally faxed to Hasanaj. However, he is no longer her pcp. clearance was faxed to [employer] [phone]. Spoke to nurse at [employer] office and notified and provider pt of delay

## 2019-08-04 NOTE — Telephone Encounter (Signed)
Fowarding to EG as we are awaiting ok to schedule

## 2019-08-04 NOTE — Telephone Encounter (Signed)
noted 

## 2019-08-04 NOTE — Telephone Encounter (Signed)
Patient called asking when her procedure may be scheduled. Stated she is very sick and can not take it much more

## 2019-08-05 ENCOUNTER — Telehealth: Payer: Self-pay | Admitting: Emergency Medicine

## 2019-08-05 NOTE — Telephone Encounter (Signed)
Signed clearance from [employer] placed on providers  desk

## 2019-08-06 ENCOUNTER — Emergency Department (HOSPITAL_COMMUNITY)
Admission: EM | Admit: 2019-08-06 | Discharge: 2019-08-06 | Disposition: A | Discharge status: Home / Self Care | Payer: Medicare Other | Attending: Emergency Medicine | Admitting: Emergency Medicine

## 2019-08-06 ENCOUNTER — Encounter (HOSPITAL_COMMUNITY): Payer: Self-pay | Admitting: *Deleted

## 2019-08-06 ENCOUNTER — Other Ambulatory Visit: Payer: Self-pay

## 2019-08-06 DIAGNOSIS — N3000 Acute cystitis without hematuria: Secondary | ICD-10-CM | POA: Diagnosis not present

## 2019-08-06 DIAGNOSIS — R339 Retention of urine, unspecified: Secondary | ICD-10-CM | POA: Diagnosis present

## 2019-08-06 LAB — URINALYSIS, ROUTINE W REFLEX MICROSCOPIC
Bilirubin Urine: NEGATIVE
Glucose, UA: NEGATIVE mg/dL
Ketones, ur: NEGATIVE mg/dL
Leukocytes,Ua: NEGATIVE
Nitrite: POSITIVE — AB
Protein, ur: NEGATIVE mg/dL
Specific Gravity, Urine: 1.005 (ref 1.005–1.030)
pH: 5 (ref 5.0–8.0)

## 2019-08-06 LAB — CBC WITH DIFFERENTIAL/PLATELET
Abs Immature Granulocytes: 0.02 10*3/uL (ref 0.00–0.07)
Basophils Absolute: 0.1 10*3/uL (ref 0.0–0.1)
Basophils Relative: 1 %
Eosinophils Absolute: 0.2 10*3/uL (ref 0.0–0.5)
Eosinophils Relative: 3 %
HCT: 36.1 % (ref 36.0–46.0)
Hemoglobin: 11.4 g/dL — ABNORMAL LOW (ref 12.0–15.0)
Immature Granulocytes: 0 %
Lymphocytes Relative: 31 %
Lymphs Abs: 2.4 10*3/uL (ref 0.7–4.0)
MCH: 30.3 pg (ref 26.0–34.0)
MCHC: 31.6 g/dL (ref 30.0–36.0)
MCV: 96 fL (ref 80.0–100.0)
Monocytes Absolute: 0.6 10*3/uL (ref 0.1–1.0)
Monocytes Relative: 8 %
Neutro Abs: 4.4 10*3/uL (ref 1.7–7.7)
Neutrophils Relative %: 57 %
Platelets: 316 10*3/uL (ref 150–400)
RBC: 3.76 MIL/uL — ABNORMAL LOW (ref 3.87–5.11)
RDW: 14 % (ref 11.5–15.5)
WBC: 7.8 10*3/uL (ref 4.0–10.5)
nRBC: 0 % (ref 0.0–0.2)

## 2019-08-06 LAB — BASIC METABOLIC PANEL
Anion gap: 10 (ref 5–15)
BUN: 14 mg/dL (ref 8–23)
CO2: 24 mmol/L (ref 22–32)
Calcium: 8.5 mg/dL — ABNORMAL LOW (ref 8.9–10.3)
Chloride: 104 mmol/L (ref 98–111)
Creatinine, Ser: 1.4 mg/dL — ABNORMAL HIGH (ref 0.44–1.00)
GFR calc Af Amer: 42 mL/min — ABNORMAL LOW (ref >60)
GFR calc non Af Amer: 36 mL/min — ABNORMAL LOW (ref >60)
Glucose, Bld: 112 mg/dL — ABNORMAL HIGH (ref 70–99)
Potassium: 3.2 mmol/L — ABNORMAL LOW (ref 3.5–5.1)
Sodium: 138 mmol/L (ref 135–145)

## 2019-08-06 MED ORDER — SODIUM CHLORIDE 0.9 % IV BOLUS
1000.0000 mL | Freq: Once | INTRAVENOUS | Status: AC
  Administered 2019-08-06: 1000 mL via INTRAVENOUS

## 2019-08-06 MED ORDER — SODIUM CHLORIDE 0.9 % IV SOLN
1.0000 g | Freq: Once | INTRAVENOUS | Status: AC
  Administered 2019-08-06: 1 g via INTRAVENOUS
  Filled 2019-08-06: qty 10

## 2019-08-06 MED ORDER — CEPHALEXIN 500 MG PO CAPS: 500.0000 mg | ORAL_CAPSULE | Freq: Three times a day (TID) | ORAL | 0 refills | Status: AC

## 2019-08-06 NOTE — ED Triage Notes (Signed)
Unable to void since 1100 today,

## 2019-08-06 NOTE — Discharge Instructions (Signed)
Follow-up with your doctor next week to have your urine sample and creatinine rechecked.  Return to the ED for fever vomiting or other concerning symptoms

## 2019-08-06 NOTE — ED Provider Notes (Signed)
[facility] EMERGENCY DEPARTMENT Provider Note   CSN: [id number] Arrival date & time: 08/06/19  1839     History Chief Complaint  Patient presents with  . Urinary Retention    Christine Rogers is a 79 y.o. female.  HPI   Pt presents to the ED with complaints of decreased urination and pain with urination.  Patient does not feel that she is urinating normally since lemon a.m. today.  Patient feels a sharp discomfort in her suprapubic region.  She does not feel necessarily that her bladder is distended however.  She is not having any abdominal pain.  No fevers or chills.  It does hurt when she urinates. Past Medical History:  Diagnosis Date  . Acute pulmonary embolism (HCC) 10/19/2015  . Bipolar 1 disorder (HCC)   . COPD (chronic obstructive pulmonary disease) (HCC)   . DDD (degenerative disc disease), lumbar   . Depression   . Emphysema of lung (HCC)   . GERD (gastroesophageal reflux disease)   . High cholesterol   . History of kidney stones   . Hypertension   . Sleep apnea    cannot tolerate CPAP    Patient Active Problem List   Diagnosis Date Noted  . Abdominal pain 07/22/2019  . Dilated bile duct 12/17/2017  . Chronic cholecystitis   . Acute pulmonary embolism (HCC) 10/19/2015  . Chest pain on exertion 04/06/2015  . RUQ pain 03/08/2015  . Mucosal abnormality of stomach   . GERD (gastroesophageal reflux disease) 12/27/2014  . N&V (nausea and vomiting) 12/27/2014  . Weight gain 12/27/2014  . Constipation 12/27/2014  . Dyslipidemia 08/22/2009  . TOBACCO ABUSE 08/22/2009  . ESSENTIAL HYPERTENSION, BENIGN 08/22/2009  . CORONARY ATHEROSCLEROSIS NATIVE CORONARY ARTERY 08/22/2009  . CLAUDICATION 08/22/2009    Past Surgical History:  Procedure Laterality Date  . ABDOMINAL HYSTERECTOMY    . BACK SURGERY     lumbar fusion  . BIOPSY  01/23/2015   Procedure: BIOPSY;  Surgeon: Corbin Ade, MD;  Location: [facility];  Service: Endoscopy;;  gastric biopsies  .  CATARACT EXTRACTION, BILATERAL Bilateral   . CHOLECYSTECTOMY N/A 12/25/2016   Procedure: LAPAROSCOPIC CHOLECYSTECTOMY;  Surgeon: Franky Macho, MD;  Location: [facility] OR[facility];  Service: General;  Laterality: N/A;  . ESOPHAGOGASTRODUODENOSCOPY (EGD) WITH PROPOFOL N/A 01/23/2015   RMR: abnormal gastric mucosa.pyloric channel of uncertain significance status post biopys  . TONSILLECTOMY       OB History    Gravida  5   Para  5   Term  4   Preterm  1   AB      Living  4     SAB      TAB      Ectopic      Multiple      Live Births              Family History  Problem Relation Age of Onset  . Heart disease Father   . Heart attack Father   . Heart attack Brother   . Heart Problems Brother   . Heart attack Brother   . Cancer Child   . Cerebral palsy Daughter   . Colon cancer Neg Hx     Social History   Tobacco Use  . Smoking status: Current Some Day Smoker    Packs/day: 1.00    Years: 50.00    Pack years: 50.00    Types: Cigarettes    Start date: 01/30/1963  . Smokeless tobacco: Never Used  Vaping Use  . Vaping Use: Never used  Substance Use Topics  . Alcohol use: No  . Drug use: No    Home Medications Prior to Admission medications   Medication Sig Start Date End Date Taking? Authorizing Provider  apixaban (ELIQUIS) 5 MG TABS tablet Take 5 mg by mouth 2 (two) times daily.   Yes [provider]  aspirin EC 81 MG tablet Take 81 mg by mouth daily. Swallow whole.   Yes [provider]  lisinopril (PRINIVIL,ZESTRIL) 5 MG tablet Take 10 mg by mouth daily.    Yes [provider]  mirtazapine (REMERON) 15 MG tablet Take 30 mg by mouth at bedtime.    Yes [provider]  cephALEXin (KEFLEX) 500 MG capsule Take 1 capsule (500 mg total) by mouth 3 (three) times daily for 7 days. 08/06/19 08/13/19  Linwood Dibbles, MD  clonazePAM (KLONOPIN) 0.5 MG tablet Take 0.5 mg by mouth at bedtime as needed for anxiety.  Patient not taking: Reported on  08/06/2019 02/12/16   [provider]    Allergies    Imitrex [sumatriptan], Ciprofloxacin, Fentanyl, Gabapentin, and Demerol [meperidine]  Review of Systems   Review of Systems  All other systems reviewed and are negative.   Physical Exam Updated Vital Signs BP (!) 154/91   Pulse 81   Temp 99.1 F (37.3 C) (Oral)   Resp 15   SpO2 94%   Physical Exam Vitals and nursing note reviewed.  Constitutional:      General: She is not in acute distress.    Appearance: She is well-developed.  HENT:     Head: Normocephalic and atraumatic.     Right Ear: External ear normal.     Left Ear: External ear normal.  Eyes:     General: No scleral icterus.       Right eye: No discharge.        Left eye: No discharge.     Conjunctiva/sclera: Conjunctivae normal.  Neck:     Trachea: No tracheal deviation.  Cardiovascular:     Rate and Rhythm: Normal rate and regular rhythm.  Pulmonary:     Effort: Pulmonary effort is normal. No respiratory distress.     Breath sounds: Normal breath sounds. No stridor. No wheezing or rales.  Abdominal:     General: Bowel sounds are normal. There is no distension.     Palpations: Abdomen is soft.     Tenderness: There is no abdominal tenderness. There is no guarding or rebound.  Musculoskeletal:        General: No tenderness.     Cervical back: Neck supple.  Skin:    General: Skin is warm and dry.     Findings: No rash.  Neurological:     Mental Status: She is alert.     Cranial Nerves: No cranial nerve deficit (no facial droop, extraocular movements intact, no slurred speech).     Sensory: No sensory deficit.     Motor: No abnormal muscle tone or seizure activity.     Coordination: Coordination normal.     ED Results / Procedures / Treatments   Labs (all labs ordered are listed, but only abnormal results are displayed) Labs Reviewed  CBC WITH DIFFERENTIAL/PLATELET - Abnormal; Notable for the following components:      Result Value    RBC 3.76 (*)    Hemoglobin 11.4 (*)    All other components within normal limits  BASIC METABOLIC PANEL - Abnormal; Notable for the  following components:   Potassium 3.2 (*)    Glucose, Bld 112 (*)    Creatinine, Ser 1.40 (*)    Calcium 8.5 (*)    GFR calc non Af Amer 36 (*)    GFR calc Af Amer 42 (*)    All other components within normal limits  URINALYSIS, ROUTINE W REFLEX MICROSCOPIC - Abnormal; Notable for the following components:   Hgb urine dipstick MODERATE (*)    Nitrite POSITIVE (*)    Bacteria, UA MANY (*)    All other components within normal limits  URINE CULTURE    EKG None  Radiology No results found.  Procedures Procedures (including critical care time)  Medications Ordered in ED Medications  cefTRIAXone (ROCEPHIN) 1 g in sodium chloride 0.9 % 100 mL IVPB (1 g Intravenous New Bag/Given 08/06/19 2212)  sodium chloride 0.9 % bolus 1,000 mL (1,000 mLs Intravenous New Bag/Given 08/06/19 2211)    ED Course  I have reviewed the triage vital signs and the nursing notes.  Pertinent labs & imaging results that were available during my care of the patient were reviewed by me and considered in my medical decision making (see chart for details).  Clinical Course as of Aug 06 2235  Fri Aug 06, 2019  2059 Only 28 cc noted on bladder scan   [JK]  2148 Urinalysis does show many bacteria and positive nitrite.   [JK]  2148 CBC is normal.  Creatinine is elevated compared to previous   [JK]    Clinical Course User Index [JK] Linwood Dibbles, MD   MDM Rules/Calculators/A&P                          Patient presented to the ED for evaluation of urinary symptoms.  Patient complained of decreased urination.  She was also having pain.  Bladder scan did not show signs of urinary retention.  Renal function showed slight bump in creatinine but no signs of acute renal failure.  Patient was given IV fluids.  Urinalysis was consistent with urinary tract infection.  Patient was given a  dose of antibiotics.  Will discharge home on Keflex.  Discussed outpatient follow-up with primary care doctor.  Warning signs precautions discussed. Final Clinical Impression(s) / ED Diagnoses Final diagnoses:  Acute cystitis without hematuria    Rx / DC Orders ED Discharge Orders         Ordered    cephALEXin (KEFLEX) 500 MG capsule  3 times daily     Discontinue  Reprint     08/06/19 2237           Linwood Dibbles, MD 08/06/19 2238

## 2019-08-09 DIAGNOSIS — J439 Emphysema, unspecified: Secondary | ICD-10-CM | POA: Diagnosis not present

## 2019-08-09 DIAGNOSIS — R110 Nausea: Secondary | ICD-10-CM | POA: Diagnosis not present

## 2019-08-09 LAB — URINE CULTURE: Culture: >=100000 — AB

## 2019-08-10 ENCOUNTER — Telehealth: Payer: Self-pay

## 2019-08-10 NOTE — Telephone Encounter (Signed)
Post ED Visit - Positive Culture Follow-up  Culture report reviewed by antimicrobial stewardship pharmacist: [facility] []  [facility], Pharm.D. []  [facility], Pharm.D., BCPS AQ-ID []  , Pharm.D., BCPS []  Celedonio Miyamoto, Pharm.D., BCPS []  [place], Garvin Fila.D., BCPS, AAHIVP []  , Pharm.D., BCPS, AAHIVP []  Georgina Pillion, PharmD, BCPS []  , PharmD, BCPS []  [place], PharmD, BCPS []  [address], PharmD []  , PharmD, BCPS []  Estella Husk, PharmD Pharm [facility] []  , PharmD []  Phillips Climes, PharmD []  , PharmD []  [employer], Rph []  ) Verlan Friends, PharmD []  , PharmD []  Mervyn Gay, PharmD []  , PharmD []  Vinnie Level, PharmD []  Lamar Sprinkles, PharmD []  Autumn Patty, PharmD []  , PharmD []  Len Childs, PharmD   Positive urine culture Treated with Cephalexin, organism sensitive to the same and no further patient follow-up is required at this time.  08/10/2019, 8:27 AM

## 2019-08-16 NOTE — Telephone Encounter (Signed)
Please advise Christine Rogers. Patient calling to schedule procedure thanks

## 2019-08-17 DIAGNOSIS — M5136 Other intervertebral disc degeneration, lumbar region: Secondary | ICD-10-CM | POA: Diagnosis not present

## 2019-08-17 DIAGNOSIS — I10 Essential (primary) hypertension: Secondary | ICD-10-CM | POA: Diagnosis not present

## 2019-08-19 NOTE — Telephone Encounter (Signed)
Pt aware.

## 2019-08-19 NOTE — Telephone Encounter (Signed)
Printed off and placed to be called once we receive schedule

## 2019-08-19 NOTE — Telephone Encounter (Signed)
Hold Eliquis for 2 days prior to procedure, no bridging Lovenox needed.  Will mark PCP hold order for scanning.

## 2019-08-20 DIAGNOSIS — R519 Headache, unspecified: Secondary | ICD-10-CM | POA: Diagnosis not present

## 2019-08-26 ENCOUNTER — Telehealth: Payer: Self-pay | Admitting: *Deleted

## 2019-08-26 NOTE — Telephone Encounter (Signed)
PA approved for EGD via [facility] website. Auth# [id number] dates 09/27/2019-12/26/2019

## 2019-08-26 NOTE — Telephone Encounter (Signed)
Called pt and she has been scheduled for procedure 09/27/2019 at 9:30am. Patient aware will need pre-op/covid test appt. Patient aware will mail instructions. Confirmed mailing address.

## 2019-08-27 ENCOUNTER — Encounter: Payer: Self-pay | Admitting: *Deleted

## 2019-09-06 DIAGNOSIS — G8929 Other chronic pain: Secondary | ICD-10-CM | POA: Diagnosis not present

## 2019-09-06 DIAGNOSIS — R110 Nausea: Secondary | ICD-10-CM | POA: Diagnosis not present

## 2019-09-06 DIAGNOSIS — I10 Essential (primary) hypertension: Secondary | ICD-10-CM | POA: Diagnosis not present

## 2019-09-15 ENCOUNTER — Encounter: Payer: Self-pay | Admitting: Internal Medicine

## 2019-09-16 DIAGNOSIS — L84 Corns and callosities: Secondary | ICD-10-CM | POA: Diagnosis not present

## 2019-09-16 DIAGNOSIS — B351 Tinea unguium: Secondary | ICD-10-CM | POA: Diagnosis not present

## 2019-09-16 DIAGNOSIS — I70203 Unspecified atherosclerosis of native arteries of extremities, bilateral legs: Secondary | ICD-10-CM | POA: Diagnosis not present

## 2019-09-16 DIAGNOSIS — M79676 Pain in unspecified toe(s): Secondary | ICD-10-CM | POA: Diagnosis not present

## 2019-09-20 DIAGNOSIS — M5136 Other intervertebral disc degeneration, lumbar region: Secondary | ICD-10-CM | POA: Diagnosis not present

## 2019-09-20 DIAGNOSIS — M549 Dorsalgia, unspecified: Secondary | ICD-10-CM | POA: Diagnosis not present

## 2019-09-20 DIAGNOSIS — K219 Gastro-esophageal reflux disease without esophagitis: Secondary | ICD-10-CM | POA: Diagnosis not present

## 2019-09-20 NOTE — Patient Instructions (Signed)
20    Your procedure is scheduled on: 09/27/2019  Report to [facility] at  8:15   AM.  Call this number if you have problems the morning of surgery: [phone]   Remember:   Follow instructions on letter from office regarding when to stop eating and drinking        No Smoking the day of procedure      Take these medicines the morning of surgery with A SIP OF WATER: Cariprazine  Do not wear jewelry, make-up or nail polish.  Do not wear lotions, powders, or perfumes. You may wear deodorant.                Do not bring valuables to the hospital.  Contacts, dentures or bridgework may not be worn into surgery.  Leave suitcase in the car. After surgery it may be brought to your room.  For patients admitted to the hospital, checkout time is 11:00 AM the day of discharge.   Patients discharged the day of surgery will not be allowed to drive home. Upper Endoscopy, Adult Upper endoscopy is a procedure to look inside the upper GI (gastrointestinal) tract. The upper GI tract is made up of:  The part of the body that moves food from your mouth to your stomach (esophagus).  The stomach.  The first part of your small intestine (duodenum). This procedure is also called esophagogastroduodenoscopy (EGD) or gastroscopy. In this procedure, your health care provider passes a thin, flexible tube (endoscope) through your mouth and down your esophagus into your stomach. A small camera is attached to the end of the tube. Images from the camera appear on a monitor in the exam room. During this procedure, your health care provider may also remove a small piece of tissue to be sent to a lab and examined under a microscope (biopsy). Your health care provider may do an upper endoscopy to diagnose cancers of the upper GI tract. You may also have this procedure to find the cause of other conditions, such as:  Stomach pain.  Heartburn.  Pain or problems when swallowing.  Nausea and vomiting.  Stomach  bleeding.  Stomach ulcers. Tell a health care provider about:  Any allergies you have.  All medicines you are taking, including vitamins, herbs, eye drops, creams, and over-the-counter medicines.  Any problems you or family members have had with anesthetic medicines.  Any blood disorders you have.  Any surgeries you have had.  Any medical conditions you have.  Whether you are pregnant or may be pregnant. What are the risks? Generally, this is a safe procedure. However, problems may occur, including:  Infection.  Bleeding.  Allergic reactions to medicines.  A tear or hole (perforation) in the esophagus, stomach, or duodenum. What happens before the procedure? Staying hydrated Follow instructions from your health care provider about hydration, which may include:  Up to 2 hours before the procedure - you may continue to drink clear liquids, such as water, clear fruit juice, black coffee, and plain tea.  Eating and drinking restrictions Follow instructions from your health care provider about eating and drinking, which may include:  8 hours before the procedure - stop eating heavy meals or foods, such as meat, fried foods, or fatty foods.  6 hours before the procedure - stop eating light meals or foods, such as toast or cereal.  6 hours before the procedure - stop drinking milk or drinks that contain milk.  2 hours before the procedure - stop drinking  clear liquids. Medicines Ask your health care provider about:  Changing or stopping your regular medicines. This is especially important if you are taking diabetes medicines or blood thinners.  Taking medicines such as aspirin and ibuprofen. These medicines can thin your blood. Do not take these medicines unless your health care provider tells you to take them.  Taking over-the-counter medicines, vitamins, herbs, and supplements. General instructions  Plan to have someone take you home from the hospital or clinic.  If  you will be going home right after the procedure, plan to have someone with you for 24 hours.  Ask your health care provider what steps will be taken to help prevent infection. What happens during the procedure?  1. An IV will be inserted into one of your veins. 2. You may be given one or more of the following: ? A medicine to help you relax (sedative). ? A medicine to numb the throat (local anesthetic). 3. You will lie on your left side on an exam table. 4. Your health care provider will pass the endoscope through your mouth and down your esophagus. 5. Your health care provider will use the scope to check the inside of your esophagus, stomach, and duodenum. Biopsies may be taken. 6. The endoscope will be removed. The procedure may vary among health care providers and hospitals. What happens after the procedure?  Your blood pressure, heart rate, breathing rate, and blood oxygen level will be monitored until you leave the hospital or clinic.  Do not drive for 24 hours if you were given a sedative during your procedure.  When your throat is no longer numb, you may be given some fluids to drink.  It is up to you to get the results of your procedure. Ask your health care provider, or the department that is doing the procedure, when your results will be ready. Summary  Upper endoscopy is a procedure to look inside the upper GI tract.  During the procedure, an IV will be inserted into one of your veins. You may be given a medicine to help you relax.  A medicine will be used to numb your throat.  The endoscope will be passed through your mouth and down your esophagus. This information is not intended to replace advice given to you by your health care provider. Make sure you discuss any questions you have with your health care provider. Document Revised: 06/18/2017 Document Reviewed: 05/26/2017 Elsevier Patient Education  [place] After  Please read the instructions outlined below and refer to this sheet in the next few weeks. These discharge instructions provide you with general information on caring for yourself after you leave the hospital. Your doctor may also give you specific  instructions. While your treatment has been planned according to the most current medical practices available, unavoidable complications occasionally occur. If you have any problems or questions after discharge, please call your doctor. HOME CARE INSTRUCTIONS Activity  You may resume your regular activity but move at a slower pace for the next 24 hours.   Take frequent rest periods for the next 24 hours.   Walking will help expel (get rid of) the air and reduce the bloated feeling in your abdomen.   No driving for 24 hours (because of the anesthesia (medicine) used during the test).   You may shower.   Do not sign any important legal documents or operate any machinery for 24 hours (because of the anesthesia used during the test).  Nutrition  Drink plenty of fluids.   You may resume your normal diet.   Begin with a light meal and progress to your normal diet.   Avoid alcoholic beverages for 24 hours or as instructed by your caregiver.  Medications You may resume your normal medications unless your caregiver tells you otherwise. What you can expect today  You may experience abdominal discomfort such as a feeling of fullness or "gas" pains.   You may experience a sore throat for 2 to 3 days. This is normal. Gargling with salt water may help this.  Follow-up Your doctor will discuss the results of your test with you. SEEK IMMEDIATE MEDICAL CARE IF:  You have excessive nausea (feeling sick to your stomach) and/or vomiting.   You have severe abdominal pain and distention (swelling).   You have trouble swallowing.   You have a  temperature over 100 F (37.8 C).   You have rectal bleeding or vomiting of blood.  Document Released: 08/08/2003 Document Revised: 12/13/2010 Document Reviewed: 02/18/2007

## 2019-09-24 ENCOUNTER — Encounter (HOSPITAL_COMMUNITY): Payer: Self-pay

## 2019-09-24 ENCOUNTER — Other Ambulatory Visit (HOSPITAL_COMMUNITY)
Admission: RE | Admit: 2019-09-24 | Discharge: 2019-09-24 | Disposition: A | Discharge status: Home / Self Care | Payer: Medicare Other | Source: Ambulatory Visit | Attending: Internal Medicine | Admitting: Internal Medicine

## 2019-09-24 ENCOUNTER — Other Ambulatory Visit: Payer: Self-pay

## 2019-09-24 ENCOUNTER — Encounter (HOSPITAL_COMMUNITY)
Admission: RE | Admit: 2019-09-24 | Discharge: 2019-09-24 | Disposition: A | Discharge status: Home / Self Care | Payer: Medicare Other | Source: Ambulatory Visit | Attending: Internal Medicine | Admitting: Internal Medicine

## 2019-09-24 ENCOUNTER — Other Ambulatory Visit (HOSPITAL_COMMUNITY): Payer: Self-pay | Admitting: Family

## 2019-09-24 ENCOUNTER — Ambulatory Visit (HOSPITAL_COMMUNITY)
Admission: RE | Admit: 2019-09-24 | Discharge: 2019-09-24 | Disposition: A | Discharge status: Home / Self Care | Payer: Medicare Other | Source: Ambulatory Visit | Attending: Family | Admitting: Family

## 2019-09-24 DIAGNOSIS — M47814 Spondylosis without myelopathy or radiculopathy, thoracic region: Secondary | ICD-10-CM | POA: Insufficient documentation

## 2019-09-24 DIAGNOSIS — M545 Low back pain, unspecified: Secondary | ICD-10-CM

## 2019-09-24 DIAGNOSIS — Z20822 Contact with and (suspected) exposure to covid-19: Secondary | ICD-10-CM | POA: Diagnosis not present

## 2019-09-24 DIAGNOSIS — Z981 Arthrodesis status: Secondary | ICD-10-CM | POA: Diagnosis not present

## 2019-09-24 DIAGNOSIS — J984 Other disorders of lung: Secondary | ICD-10-CM | POA: Diagnosis not present

## 2019-09-24 LAB — CBC WITH DIFFERENTIAL/PLATELET
Abs Immature Granulocytes: 0.02 10*3/uL (ref 0.00–0.07)
Basophils Absolute: 0.1 10*3/uL (ref 0.0–0.1)
Basophils Relative: 1 %
Eosinophils Absolute: 0.2 10*3/uL (ref 0.0–0.5)
Eosinophils Relative: 3 %
HCT: 36.7 % (ref 36.0–46.0)
Hemoglobin: 11.5 g/dL — ABNORMAL LOW (ref 12.0–15.0)
Immature Granulocytes: 0 %
Lymphocytes Relative: 21 %
Lymphs Abs: 1.3 10*3/uL (ref 0.7–4.0)
MCH: 28.8 pg (ref 26.0–34.0)
MCHC: 31.3 g/dL (ref 30.0–36.0)
MCV: 91.8 fL (ref 80.0–100.0)
Monocytes Absolute: 0.5 10*3/uL (ref 0.1–1.0)
Monocytes Relative: 8 %
Neutro Abs: 4.1 10*3/uL (ref 1.7–7.7)
Neutrophils Relative %: 67 %
Platelets: 284 10*3/uL (ref 150–400)
RBC: 4 MIL/uL (ref 3.87–5.11)
RDW: 15.4 % (ref 11.5–15.5)
WBC: 6.3 10*3/uL (ref 4.0–10.5)
nRBC: 0 % (ref 0.0–0.2)

## 2019-09-24 LAB — BASIC METABOLIC PANEL
Anion gap: 9 (ref 5–15)
BUN: 13 mg/dL (ref 8–23)
CO2: 24 mmol/L (ref 22–32)
Calcium: 8.5 mg/dL — ABNORMAL LOW (ref 8.9–10.3)
Chloride: 107 mmol/L (ref 98–111)
Creatinine, Ser: 0.95 mg/dL (ref 0.44–1.00)
GFR calc Af Amer: >60 mL/min (ref >60)
GFR calc non Af Amer: 58 mL/min — ABNORMAL LOW (ref >60)
Glucose, Bld: 94 mg/dL (ref 70–99)
Potassium: 3.1 mmol/L — ABNORMAL LOW (ref 3.5–5.1)
Sodium: 140 mmol/L (ref 135–145)

## 2019-09-24 LAB — SARS CORONAVIRUS 2 (TAT 6-24 HRS): SARS Coronavirus 2: NEGATIVE

## 2019-09-27 ENCOUNTER — Encounter (HOSPITAL_COMMUNITY)
Admission: RE | Disposition: A | Discharge status: Home / Self Care | Payer: Self-pay | Source: Home / Self Care | Attending: Internal Medicine

## 2019-09-27 ENCOUNTER — Ambulatory Visit (HOSPITAL_COMMUNITY): Payer: Medicare Other | Admitting: Anesthesiology

## 2019-09-27 ENCOUNTER — Ambulatory Visit (HOSPITAL_COMMUNITY)
Admission: RE | Admit: 2019-09-27 | Discharge: 2019-09-27 | Disposition: A | Discharge status: Home / Self Care | Payer: Medicare Other | Attending: Internal Medicine | Admitting: Internal Medicine

## 2019-09-27 ENCOUNTER — Encounter (HOSPITAL_COMMUNITY): Payer: Self-pay | Admitting: Internal Medicine

## 2019-09-27 ENCOUNTER — Other Ambulatory Visit: Payer: Self-pay

## 2019-09-27 DIAGNOSIS — G4730 Sleep apnea, unspecified: Secondary | ICD-10-CM | POA: Diagnosis not present

## 2019-09-27 DIAGNOSIS — K3189 Other diseases of stomach and duodenum: Secondary | ICD-10-CM | POA: Diagnosis not present

## 2019-09-27 DIAGNOSIS — Z9049 Acquired absence of other specified parts of digestive tract: Secondary | ICD-10-CM | POA: Diagnosis not present

## 2019-09-27 DIAGNOSIS — K259 Gastric ulcer, unspecified as acute or chronic, without hemorrhage or perforation: Secondary | ICD-10-CM | POA: Diagnosis not present

## 2019-09-27 DIAGNOSIS — Z8249 Family history of ischemic heart disease and other diseases of the circulatory system: Secondary | ICD-10-CM | POA: Diagnosis not present

## 2019-09-27 DIAGNOSIS — F419 Anxiety disorder, unspecified: Secondary | ICD-10-CM | POA: Insufficient documentation

## 2019-09-27 DIAGNOSIS — Z7901 Long term (current) use of anticoagulants: Secondary | ICD-10-CM | POA: Diagnosis not present

## 2019-09-27 DIAGNOSIS — J439 Emphysema, unspecified: Secondary | ICD-10-CM | POA: Diagnosis not present

## 2019-09-27 DIAGNOSIS — I10 Essential (primary) hypertension: Secondary | ICD-10-CM | POA: Insufficient documentation

## 2019-09-27 DIAGNOSIS — Z7982 Long term (current) use of aspirin: Secondary | ICD-10-CM | POA: Insufficient documentation

## 2019-09-27 DIAGNOSIS — Z86711 Personal history of pulmonary embolism: Secondary | ICD-10-CM | POA: Diagnosis not present

## 2019-09-27 DIAGNOSIS — F319 Bipolar disorder, unspecified: Secondary | ICD-10-CM | POA: Insufficient documentation

## 2019-09-27 DIAGNOSIS — R112 Nausea with vomiting, unspecified: Secondary | ICD-10-CM | POA: Diagnosis not present

## 2019-09-27 DIAGNOSIS — K319 Disease of stomach and duodenum, unspecified: Secondary | ICD-10-CM | POA: Diagnosis not present

## 2019-09-27 DIAGNOSIS — F17210 Nicotine dependence, cigarettes, uncomplicated: Secondary | ICD-10-CM | POA: Diagnosis not present

## 2019-09-27 DIAGNOSIS — J449 Chronic obstructive pulmonary disease, unspecified: Secondary | ICD-10-CM | POA: Diagnosis not present

## 2019-09-27 HISTORY — PX: BIOPSY: SHX5522

## 2019-09-27 HISTORY — PX: ESOPHAGOGASTRODUODENOSCOPY (EGD) WITH PROPOFOL: SHX5813

## 2019-09-27 SURGERY — ESOPHAGOGASTRODUODENOSCOPY (EGD) WITH PROPOFOL
Anesthesia: General

## 2019-09-27 MED ORDER — LACTATED RINGERS IV SOLN
Freq: Once | INTRAVENOUS | Status: AC
  Administered 2019-09-27: 1000 mL via INTRAVENOUS

## 2019-09-27 MED ORDER — LIDOCAINE VISCOUS HCL 2 % MT SOLN
OROMUCOSAL | Status: AC
  Filled 2019-09-27: qty 15

## 2019-09-27 MED ORDER — GLYCOPYRROLATE 0.2 MG/ML IJ SOLN
INTRAMUSCULAR | Status: AC
  Filled 2019-09-27: qty 1

## 2019-09-27 MED ORDER — PROPOFOL 500 MG/50ML IV EMUL
INTRAVENOUS | Status: DC | PRN
  Administered 2019-09-27: 150 ug/kg/min via INTRAVENOUS

## 2019-09-27 MED ORDER — LACTATED RINGERS IV SOLN: INTRAVENOUS | Status: DC | PRN

## 2019-09-27 MED ORDER — PROPOFOL 10 MG/ML IV BOLUS
INTRAVENOUS | Status: DC | PRN
  Administered 2019-09-27 (×2): 20 mg via INTRAVENOUS

## 2019-09-27 MED ORDER — STERILE WATER FOR IRRIGATION IR SOLN
Status: DC | PRN
  Administered 2019-09-27: 1.5 mL

## 2019-09-27 MED ORDER — GLYCOPYRROLATE 0.2 MG/ML IJ SOLN
0.2000 mg | Freq: Once | INTRAMUSCULAR | Status: AC
  Administered 2019-09-27: 0.2 mg via INTRAVENOUS

## 2019-09-27 MED ORDER — CHLORHEXIDINE GLUCONATE CLOTH 2 % EX PADS: 6.0000 | MEDICATED_PAD | Freq: Once | CUTANEOUS | Status: DC

## 2019-09-27 MED ORDER — PROPOFOL 10 MG/ML IV BOLUS
INTRAVENOUS | Status: AC
  Filled 2019-09-27: qty 60

## 2019-09-27 MED ORDER — LIDOCAINE VISCOUS HCL 2 % MT SOLN
15.0000 mL | Freq: Once | OROMUCOSAL | Status: AC
  Administered 2019-09-27: 15 mL via OROMUCOSAL

## 2019-09-27 NOTE — Anesthesia Preprocedure Evaluation (Addendum)
Anesthesia Evaluation  Patient identified by MRN, date of birth, ID band Patient awake    Reviewed: Allergy & Precautions, NPO status , Patient's Chart, lab work & pertinent test results  History of Anesthesia Complications Negative for: history of anesthetic complications  Airway Mallampati: II  TM Distance: >3 FB Neck ROM: Full    Dental  (+) Edentulous Upper, Edentulous Lower   Pulmonary sleep apnea , COPD,  COPD inhaler, Current SmokerPatient did not abstain from smoking.,    Pulmonary exam normal breath sounds clear to auscultation       Cardiovascular Exercise Tolerance: Poor hypertension, Pt. on medications + CAD and + Peripheral Vascular Disease  Normal cardiovascular exam Rhythm:Regular Rate:Normal  24-Sep-2019 08:30:19 [facility] ROUTINE RECORD Sinus rhythm with Premature atrial complexes Left axis deviation Abnormal QRS-T angle, consider primary T wave abnormality Abnormal ECG   Neuro/Psych PSYCHIATRIC DISORDERS Depression Bipolar Disorder    GI/Hepatic Neg liver ROS, GERD  Medicated and Controlled,  Endo/Other  negative endocrine ROS  Renal/GU negative Renal ROS  negative genitourinary   Musculoskeletal  (+) Arthritis ,   Abdominal   Peds negative pediatric ROS (+)  Hematology negative hematology ROS (+)   Anesthesia Other Findings   Reproductive/Obstetrics                           Anesthesia Physical Anesthesia Plan  ASA: III  Anesthesia Plan: General   Post-op Pain Management:    Induction: Intravenous  PONV Risk Score and Plan: TIVA  Airway Management Planned: Nasal Cannula and Natural Airway  Additional Equipment:   Intra-op Plan:   Post-operative Plan:   Informed Consent: I have reviewed the patients History and Physical, chart, labs and discussed the procedure including the risks, benefits and alternatives for the proposed  anesthesia with the patient or authorized representative who has indicated his/her understanding and acceptance.     Dental advisory given  Plan Discussed with: CRNA and Surgeon  Anesthesia Plan Comments:        Anesthesia Quick Evaluation

## 2019-09-27 NOTE — Discharge Instructions (Signed)
EGD Discharge instructions Please read the instructions outlined below and refer to this sheet in the next few weeks. These discharge instructions provide you with general information on caring for yourself after you leave the hospital. Your doctor may also give you specific instructions. While your treatment has been planned according to the most current medical practices available, unavoidable complications occasionally occur. If you have any problems or questions after discharge, please call your doctor. ACTIVITY  You may resume your regular activity but move at a slower pace for the next 24 hours.   Take frequent rest periods for the next 24 hours.   Walking will help expel (get rid of) the air and reduce the bloated feeling in your abdomen.   No driving for 24 hours (because of the anesthesia (medicine) used during the test).   You may shower.   Do not sign any important legal documents or operate any machinery for 24 hours (because of the anesthesia used during the test).  NUTRITION  Drink plenty of fluids.   You may resume your normal diet.   Begin with a light meal and progress to your normal diet.   Avoid alcoholic beverages for 24 hours or as instructed by your caregiver.  MEDICATIONS  You may resume your normal medications unless your caregiver tells you otherwise.  WHAT YOU CAN EXPECT TODAY  You may experience abdominal discomfort such as a feeling of fullness or "gas" pains.  FOLLOW-UP  Your doctor will discuss the results of your test with you.  SEEK IMMEDIATE MEDICAL ATTENTION IF ANY OF THE FOLLOWING OCCUR:  Excessive nausea (feeling sick to your stomach) and/or vomiting.   Severe abdominal pain and distention (swelling).   Trouble swallowing.   Temperature over 101 F (37.8 C).   Rectal bleeding or vomiting of blood.   You have a stomach ulcer your stomach was inflamed.  Biopsies taken Begin Protonix 40 mg twice daily. Further recommendations to follow  pending review of pathology report  Office visit with [place] in 12 weeks  Stop taking BC/Goody's and all NSAID drugs like Aleve and Advil.  No aspirin.  At patient request, I called Ajanay Cower at [phone] and reviewed results   Resume Eliquis tomorrow      Monitored Anesthesia Care, Care After These instructions provide you with information about caring for yourself after your procedure. Your health care provider may also give you more specific instructions. Your treatment has been planned according to current medical practices, but problems sometimes occur. Call your health care provider if you have any problems or questions after your procedure. What can I expect after the procedure? After your procedure, you may:  Feel sleepy for several hours.  Feel clumsy and have poor balance for several hours.  Feel forgetful about what happened after the procedure.  Have poor judgment for several hours.  Feel nauseous or vomit.  Have a sore throat if you had a breathing tube during the procedure. Follow these instructions at home: For at least 24 hours after the procedure:      Have a responsible adult stay with you. It is important to have someone help care for you until you are awake and alert.  Rest as needed.  Do not: ? Participate in activities in which you could fall or become injured. ? Drive. ? Use heavy machinery. ? Drink alcohol. ? Take sleeping pills or medicines that cause drowsiness. ? Make important decisions or sign legal documents. ? Take care of children on your  own. Eating and drinking  Follow the diet that is recommended by your health care provider.  If you vomit, drink water, juice, or soup when you can drink without vomiting.  Make sure you have little or no nausea before eating solid foods. General instructions  Take over-the-counter and prescription medicines only as told by your health care provider.  If you have sleep apnea, surgery and  certain medicines can increase your risk for breathing problems. Follow instructions from your health care provider about wearing your sleep device: ? Anytime you are sleeping, including during daytime naps. ? While taking prescription pain medicines, sleeping medicines, or medicines that make you drowsy.  If you smoke, do not smoke without supervision.  Keep all follow-up visits as told by your health care provider. This is important. Contact a health care provider if:  You keep feeling nauseous or you keep vomiting.  You feel light-headed.  You develop a rash.  You have a fever. Get help right away if:  You have trouble breathing. Summary  For several hours after your procedure, you may feel sleepy and have poor judgment.  Have a responsible adult stay with you for at least 24 hours or until you are awake and alert. This information is not intended to replace advice given to you by your health care provider. Make sure you discuss any questions you have with your health care provider. Document Revised: 03/24/2017 Document Reviewed: 04/16/2015 Elsevier Patient Education  2020 [employer].

## 2019-09-27 NOTE — Transfer of Care (Signed)
Immediate Anesthesia Transfer of Care Note  Patient: Christine Rogers  Procedure(s) Performed: ESOPHAGOGASTRODUODENOSCOPY (EGD) WITH PROPOFOL (N/A ) BIOPSY  Patient Location: PACU  Anesthesia Type:General  Level of Consciousness: awake  Airway & Oxygen Therapy: Patient Spontanous Breathing  Post-op Assessment: Report given to RN  Post vital signs: Reviewed and stable  Last Vitals:  Vitals Value Taken Time  BP 152/120 09/27/19 1027  Temp    Pulse 78 09/27/19 1029  Resp 32 09/27/19 1029  SpO2 93 % 09/27/19 1029  Vitals shown include unvalidated device data.  Last Pain:  Vitals:   09/27/19 1009  TempSrc:   PainSc: 0-No pain      Patients Stated Pain Goal: 8 (09/27/19 0845)  Complications: No complications documented.

## 2019-09-27 NOTE — Anesthesia Postprocedure Evaluation (Signed)
Anesthesia Post Note  Patient: Christine Rogers  Procedure(s) Performed: ESOPHAGOGASTRODUODENOSCOPY (EGD) WITH PROPOFOL (N/A ) BIOPSY  Patient location during evaluation: PACU Anesthesia Type: General Level of consciousness: awake and alert and oriented Pain management: pain level controlled Vital Signs Assessment: post-procedure vital signs reviewed and stable Respiratory status: spontaneous breathing Cardiovascular status: blood pressure returned to baseline and stable Postop Assessment: no apparent nausea or vomiting Anesthetic complications: no   No complications documented.   Last Vitals:  Vitals:   09/27/19 0845 09/27/19 1029  BP: (!) 148/78 126/74  Pulse:  78  Resp: 20 (!) 29  Temp: 36.6 C 36.4 C  SpO2: 97%     Last Pain:  Vitals:   09/27/19 1009  TempSrc:   PainSc: 0-No pain                 Jerriann Schrom

## 2019-09-27 NOTE — H&P (Signed)
@LOGO @   Primary Care Physician:  , FNP Primary Gastroenterologist:  Dr. Jason Coop  Pre-Procedure History & Physical: HPI:  Christine Rogers is a 79 y.o. female here for further evaluation of nausea and vomiting via EGD.  No dysphagia.  States started on Reglan 5 mg before meals by PCP a few weeks ago and this is made her feel much better.  Last Eliquis 918.  Past Medical History:  Diagnosis Date  . Acute pulmonary embolism (HCC) 10/19/2015  . Bipolar 1 disorder (HCC)   . COPD (chronic obstructive pulmonary disease) (HCC)   . DDD (degenerative disc disease), lumbar   . Depression   . Emphysema of lung (HCC)   . GERD (gastroesophageal reflux disease)   . High cholesterol   . History of kidney stones   . Hypertension   . Sleep apnea    cannot tolerate CPAP    Past Surgical History:  Procedure Laterality Date  . ABDOMINAL HYSTERECTOMY    . BACK SURGERY     lumbar fusion  . BIOPSY  01/23/2015   Procedure: BIOPSY;  Surgeon: 01/25/2015, MD;  Location: [facility];  Service: Endoscopy;;  gastric biopsies  . CATARACT EXTRACTION, BILATERAL Bilateral   . CHOLECYSTECTOMY N/A 12/25/2016   Procedure: LAPAROSCOPIC CHOLECYSTECTOMY;  Surgeon: 12/27/2016, MD;  Location: [facility];  Service: General;  Laterality: N/A;  . ESOPHAGOGASTRODUODENOSCOPY (EGD) WITH PROPOFOL N/A 01/23/2015   RMR: abnormal gastric mucosa.pyloric channel of uncertain significance status post biopys  . TONSILLECTOMY      Prior to Admission medications   Medication Sig Start Date End Date Taking? Authorizing Provider  apixaban (ELIQUIS) 5 MG TABS tablet Take 5 mg by mouth 2 (two) times daily.   Yes [provider]  aspirin EC 81 MG tablet Take 81 mg by mouth daily. Swallow whole.   Yes [provider]  Aspirin-Caffeine (BC FAST PAIN RELIEF ARTHRITIS PO) Take 1 Package by mouth daily as needed (pain).   Yes [provider]  cariprazine (VRAYLAR) capsule Take 1.5 mg by  mouth daily.   Yes [provider]  clonazePAM (KLONOPIN) 0.5 MG tablet Take 0.5 mg by mouth at bedtime as needed for anxiety.  02/12/16  Yes [provider]  lisinopril (ZESTRIL) 20 MG tablet Take 20 mg by mouth daily.    Yes [provider]  metoCLOPramide (REGLAN) 5 MG tablet Take 5 mg by mouth 3 (three) times daily before meals.   Yes [provider]  mirtazapine (REMERON) 15 MG tablet Take 15 mg by mouth at bedtime.    Yes [provider]    Allergies as of 08/26/2019 - Review Complete 08/06/2019  Allergen Reaction Noted  . Imitrex [sumatriptan] Anaphylaxis and Swelling 04/10/2014  . Ciprofloxacin Other (See Comments)   . Fentanyl Other (See Comments) 11/05/2016  . Gabapentin  07/22/2019  . Demerol [meperidine] Nausea And Vomiting 05/30/2015    Family History  Problem Relation Age of Onset  . Heart disease Father   . Heart attack Father   . Heart attack Brother   . Heart Problems Brother   . Heart attack Brother   . Cancer Child   . Cerebral palsy Daughter   . Colon cancer Neg Hx     Social History   Socioeconomic History  . Marital status: Divorced    Spouse name: Not on file  . Number of children: Not on file  . Years of education: Not on file  . Highest  education level: Not on file  Occupational History  . Not on file  Tobacco Use  . Smoking status: Current Some Day Smoker    Packs/day: 0.50    Years: 50.00    Pack years: 25.00    Types: Cigarettes    Start date: 01/30/1963  . Smokeless tobacco: Never Used  Vaping Use  . Vaping Use: Never used  Substance and Sexual Activity  . Alcohol use: No  . Drug use: No  . Sexual activity: Not Currently    Birth control/protection: Surgical  Other Topics Concern  . Not on file  Social History Narrative  . Not on file   Social Determinants of Health   Financial Resource Strain:   . Difficulty of Paying Living Expenses: Not on file  Food Insecurity:   . Worried About  Programme researcher, broadcasting/film/video in the Last Year: Not on file  . Ran Out of Food in the Last Year: Not on file  Transportation Needs:   . Lack of Transportation (Medical): Not on file  . Lack of Transportation (Non-Medical): Not on file  Physical Activity:   . Days of Exercise per Week: Not on file  . Minutes of Exercise per Session: Not on file  Stress:   . Feeling of Stress : Not on file  Social Connections:   . Frequency of Communication with Friends and Family: Not on file  . Frequency of Social Gatherings with Friends and Family: Not on file  . Attends Religious Services: Not on file  . Active Member of Clubs or Organizations: Not on file  . Attends Banker Meetings: Not on file  . Marital Status: Not on file  Intimate Partner Violence:   . Fear of Current or Ex-Partner: Not on file  . Emotionally Abused: Not on file  . Physically Abused: Not on file  . Sexually Abused: Not on file    Review of Systems: See HPI, otherwise negative ROS  Physical Exam: BP (!) 148/78   Temp 97.9 F (36.6 C) (Oral)   Resp 20   SpO2 97%  General:   Alert,  Well-developed, well-nourished, pleasant and cooperative in NAD Neck:  Supple; no masses or thyromegaly. No significant cervical adenopathy. Lungs:  Clear throughout to auscultation.   No wheezes, crackles, or rhonchi. No acute distress. Heart:  Regular rate and rhythm; no murmurs, clicks, rubs,  or gallops. Abdomen: Non-distended, normal bowel sounds.  Soft and nontender without appreciable mass or hepatosplenomegaly.  Pulses:  Normal pulses noted. Extremities:  Without clubbing or edema.  Impression/Plan: 79 year old with nausea and vomiting much improved on Reglan empirically.  No dysphagia. Diagnostic EGD per plan today. The risks, benefits, limitations, alternatives and imponderables have been reviewed with the patient. Potential for esophageal dilation, biopsy, etc. have also been reviewed.  Questions have been answered. All  parties agreeable.     Notice: This dictation was prepared with Dragon dictation along with smaller phrase technology. Any transcriptional errors that result from this process are unintentional and may not be corrected upon review.

## 2019-09-27 NOTE — Op Note (Signed)
[facility] Patient Name: Christine Rogers Procedure Date: 09/27/2019 9:48 AM MRN: [id number] Date of Birth: 03/27/1940 Attending MD: Gennette Pac , MD CSN: [id number] Age: 79 Admit Type: Outpatient Procedure:                Upper GI endoscopy Indications:              Nausea with vomiting (better with Reglan prescribed                            by PCP) Providers:                Gennette Pac, MD, Sheran Fava, Carlean Purl RN, RN, Pandora Leiter, Technician Referring MD:             Hewitt Blade. Harle Stanford Iv Medicines:                Propofol per Anesthesia Complications:            No immediate complications. Estimated Blood Loss:     Estimated blood loss was minimal. Procedure:                Pre-Anesthesia Assessment:                           - Prior to the procedure, a History and Physical                            was performed, and patient medications and                            allergies were reviewed. The patient's tolerance of                            previous anesthesia was also reviewed. The risks                            and benefits of the procedure and the sedation                            options and risks were discussed with the patient.                            All questions were answered, and informed consent                            was obtained. Prior Anticoagulants: The patient                            last took Eliquis (apixaban) 3 days prior to the                            procedure. ASA Grade Assessment: III - A patient  with severe systemic disease. After reviewing the                            risks and benefits, the patient was deemed in                            satisfactory condition to undergo the procedure.                           After obtaining informed consent, the endoscope was                            passed under direct vision. Throughout the                             procedure, the patient's blood pressure, pulse, and                            oxygen saturations were monitored continuously. The                            [id number]) was introduced through the mouth,                            and advanced to the second part of duodenum. The                            upper GI endoscopy was accomplished with ease. Scope In: 10:15:41 AM Scope Out: 10:20:18 AM Total Procedure Duration: 0 hours 4 minutes 37 seconds  Findings:      The examined esophagus was normal. Single 5 mm cratered benign-appearing       prepyloric ulcer. Multiple surrounding erosions. No apparent       infiltrating process or tumor seen patent pylorus.      The duodenal bulb and second portion of the duodenum were normal.       Biopsies of the gastric ulcer and surrounding inflamed mucosa taken. Impression:               - Normal esophagus. Gastric ulcer and surrounding                            erosions?"biopsied                           - Normal duodenal bulb and second portion of the                            duodenum.                           -Patient's son tells me that she takes an excess                            number of BC powders frequently. Moderate Sedation:      Moderate (conscious) sedation was personally administered by an  anesthesia professional. The following parameters were monitored: oxygen       saturation, heart rate, blood pressure, respiratory rate, EKG, adequacy       of pulmonary ventilation, and response to care. Recommendation:           - Patient has a contact number available for                            emergencies. The signs and symptoms of potential                            delayed complications were discussed with the                            patient. Return to normal activities tomorrow.                            Written discharge instructions were provided to the                             patient.                           - Advance diet as tolerated. Begin Protonix 40 mg                            twice daily. Avoid NSAIDs including aspirin                            powders. Office visit with [place] in 12 weeks. Resume                            Eliquis tomorrow. Procedure Code(s):        --- Professional ---                           [phone], Esophagogastroduodenoscopy, flexible,                            transoral; diagnostic, including collection of                            specimen(s) by brushing or washing, when performed                            (separate procedure) Diagnosis Code(s):        --- Professional ---                           R11.2, Nausea with vomiting, unspecified CPT copyright 2019 American Medical Association. All rights reserved. The codes documented in this report are preliminary and upon coder review may  be revised to meet current compliance requirements. Gerrit Friends. Kalyse Meharg, MD Gennette Pac, MD 09/27/2019 10:37:17 AM This report has been signed electronically. Number of Addenda: 0

## 2019-09-28 LAB — SURGICAL PATHOLOGY

## 2019-09-30 ENCOUNTER — Encounter: Payer: Self-pay | Admitting: Internal Medicine

## 2019-09-30 ENCOUNTER — Encounter (HOSPITAL_COMMUNITY): Payer: Self-pay | Admitting: Internal Medicine

## 2019-11-29 ENCOUNTER — Other Ambulatory Visit: Payer: Self-pay | Admitting: Internal Medicine

## 2019-12-28 ENCOUNTER — Other Ambulatory Visit: Payer: Self-pay

## 2019-12-28 ENCOUNTER — Ambulatory Visit (INDEPENDENT_AMBULATORY_CARE_PROVIDER_SITE_OTHER): Payer: Medicare Other | Admitting: Nurse Practitioner

## 2019-12-28 ENCOUNTER — Encounter: Payer: Self-pay | Admitting: Nurse Practitioner

## 2019-12-28 VITALS — BP 130/76 | HR 88 | Temp 97.1°F | Ht 61.0 in | Wt 116.4 lb

## 2019-12-28 DIAGNOSIS — K219 Gastro-esophageal reflux disease without esophagitis: Secondary | ICD-10-CM

## 2019-12-28 DIAGNOSIS — R112 Nausea with vomiting, unspecified: Secondary | ICD-10-CM | POA: Diagnosis not present

## 2019-12-28 DIAGNOSIS — K5909 Other constipation: Secondary | ICD-10-CM | POA: Diagnosis not present

## 2019-12-28 DIAGNOSIS — R109 Unspecified abdominal pain: Secondary | ICD-10-CM

## 2019-12-28 DIAGNOSIS — Z0000 Encounter for general adult medical examination without abnormal findings: Secondary | ICD-10-CM

## 2019-12-28 NOTE — Progress Notes (Signed)
Cc'ed to pcp °

## 2019-12-28 NOTE — Patient Instructions (Signed)
Your health issues we discussed today were:   GERD (reflux/heartburn) with abdominal pain, nausea, and known stomach ulcer: 1. As we discussed, your upper endoscopy showed a stomach ulcer 2. Continue to take Protonix (pantoprazole) twice daily as you have been to help heal the lining of your stomach 3. It is imperative that you avoid all NSAIDs (ibuprofen, Motrin, Advil, Aleve, naproxen, Naprosyn, aspirin,, anything with "NSAID" on the container) to prevent worsening of your ulcer 4. It is also imperative that you avoid all aspirin powders (Goody powders and BC powders) to prevent worsening of your ulcer 5. Try to remove the [facility] powders from your home so you are not attempted to use them out of habit 6. Call us for any worsening or recurrent problems 7. I will speak with Dr. Jena Gauss to see if we need to repeat your endoscopy at some point  Constipation: 1. I am glad you are doing well! 2. Continue taking fiber supplement every other evening as you have been 3. You can increase this to once a day if needed 4. Call us for any worsening or severe symptoms  Preventative healthcare: 1. Have your hepatitis C antibody blood test checked when you are able to 2. As we discussed, this is a recommendation that all adults should be screened at least once in your lifetime 3. If you are not having high risk behaviors (IV drug use for example) then you should not need another test for this for the remainder of your life  Overall I recommend:  1. Continue your other current medications 2. Return for follow-up in 6 months 3. Call us for any questions or concerns   ---------------------------------------------------------------  I am glad you have gotten your COVID-19 vaccination!  Even though you are fully vaccinated you should continue to follow CDC and state/local guidelines.  ---------------------------------------------------------------   At [facility] we value your feedback. You  may receive a survey about your visit today. Please share your experience as we strive to create trusting relationships with our patients to provide genuine, compassionate, quality care.  We appreciate your understanding and patience as we review any laboratory studies, imaging, and other diagnostic tests that are ordered as we care for you. Our office policy is 5 business days for review of these results, and any emergent or urgent results are addressed in a timely manner for your best interest. If you do not hear from our office in 1 week, please contact us.   We also encourage the use of MyChart, which contains your medical information for your review as well. If you are not enrolled in this feature, an access code is on this after visit summary for your convenience. Thank you for allowing [place] to be involved in your care.  It was great to see you today!  I hope you have a Merry Christmas and [address]!!

## 2019-12-28 NOTE — Progress Notes (Signed)
Referring Provider: Dorene Grebe* Primary Care Physician:  Jason Coop, FNP Primary GI:  Dr. Jena Gauss  Chief Complaint  Patient presents with  . pp f/u    Doing ok    HPI:   Christine Rogers is a 79 y.o. female who presents for post procedure follow-up. The patient was last seen in our office 07/22/2019 for constipation, abdominal pain, nausea and vomiting. Noted chronic history of nausea, dilated CBD. GES on file normal. Chronic GERD is well generally well managed on Dexilant. History of BC powder use despite recommendations. 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 Previously declined colonoscopy update.  MRI/MRCP 12/24/2017 with similar appearance of intra and extrahepatic biliary duct dilation status post cholecystectomy without obstructing mass lesion and no MR evidence of choledocholithiasis. Otherwise unremarkable exam and recommended EGD. Her endoscopy was previously scheduled but she canceled due to a pinched nerve.  At her last visit noted she was taken off Dexilant by primary care due to pharmacist recommendations of adverse effects. She is now having recurrent GERD but she indicated Dexilant did not really help anyway. She is taking Carafate which helps and she has a flare. Chronic right upper quadrant pain, chronic COPD but baseline breathing. The patient notes her recent echocardiogram is good. Rare vomiting. Previously states she felt great on a recent course of steroids but was told she could not be on this chronically. Uses Linzess as needed for constipation which does well. On Zofran and Phenergan that she states has not really helped her nausea. Recommended continue Carafate, Zofran, Phenergan. Schedule upper endoscopy to evaluate (after clearance from primary care to hold Eliquis for 2 days), continue Linzess, follow-up in 2 months.  After some miscommunication about who the patient's primary  care is, we finally received clearance to hold on 08/05/2019. No bridging Lovenox was required.  EGD completed 09/27/2019 which found normal esophagus, gastric ulcer and surrounding erosions status post biopsy, normal duodenum. Today the exam the patient's son indicated that she takes an excessive number of BC powders frequently. Surgical pathology found the biopsies to be gastric antral mucosa with marked nonspecific reactive gastropathy with erosions, negative for H. pylori. Recommended begin Protonix 40 mg twice daily, avoid NSAIDs including aspirin powders, follow-up in 12 weeks.  Today she states she is doing okay overall. She has started taking a fiber supplement every other evening which has helped with her bowel movements. No having a bowel movement every day, soft and passes easily. She is still taking BC powders about once a day. She feels this is more habit-based than anything else. She is having epigastric to RUQ pain every few days. Is wondering if emphysema or COPD is contributing. She is s/p cholecystectomy. Nausea has since subsided/resolved. Denies hematochezia, melena, fever, chills, unintentional weight loss. Denies URI or flu-like symptoms. Denies loss of sense of taste or smell. The patient has not received COVID-19 vaccination(s). Denies chest pain, dyspnea, dizziness, lightheadedness, syncope, near syncope. Denies any other upper or lower GI symptoms.  Past Medical History:  Diagnosis Date  . Acute pulmonary embolism (HCC) 10/19/2015  . Bipolar 1 disorder (HCC)   . COPD (chronic obstructive pulmonary disease) (HCC)   . DDD (degenerative disc disease), lumbar   . Depression   . Emphysema of lung (HCC)   . GERD (gastroesophageal reflux disease)   . High cholesterol   . History of kidney stones   . Hypertension   .  Sleep apnea    cannot tolerate CPAP    Past Surgical History:  Procedure Laterality Date  . ABDOMINAL HYSTERECTOMY    . BACK SURGERY     lumbar fusion  .  BIOPSY  01/23/2015   Procedure: BIOPSY;  Surgeon: Corbin Adeobert M Rourk, MD;  Location: [facility];  Service: Endoscopy;;  gastric biopsies  . BIOPSY  09/27/2019   Procedure: BIOPSY;  Surgeon: Corbin Adeourk, Robert M, MD;  Location: [facility];  Service: Endoscopy;;  . CATARACT EXTRACTION, BILATERAL Bilateral   . CHOLECYSTECTOMY N/A 12/25/2016   Procedure: LAPAROSCOPIC CHOLECYSTECTOMY;  Surgeon: Franky MachoJenkins, Mark, MD;  Location: [facility];  Service: General;  Laterality: N/A;  . ESOPHAGOGASTRODUODENOSCOPY (EGD) WITH PROPOFOL N/A 01/23/2015   RMR: abnormal gastric mucosa.pyloric channel of uncertain significance status post biopys  . ESOPHAGOGASTRODUODENOSCOPY (EGD) WITH PROPOFOL N/A 09/27/2019   Procedure: ESOPHAGOGASTRODUODENOSCOPY (EGD) WITH PROPOFOL;  Surgeon: Corbin Adeourk, Robert M, MD;  Location: [facility];  Service: Endoscopy;  Laterality: N/A;  9:30am  . TONSILLECTOMY      Current Outpatient Medications  Medication Sig Dispense Refill  . amLODipine (NORVASC) 5 MG tablet Take 5 mg by mouth daily.    Marland Kitchen. apixaban (ELIQUIS) 5 MG TABS tablet Take 5 mg by mouth 2 (two) times daily.    . Aspirin-Caffeine (BC FAST PAIN RELIEF ARTHRITIS PO) Take 1 Package by mouth daily as needed (pain).    . Calcium Polycarbophil (FIBER-CAPS PO) Take by mouth every other day.    . cariprazine (VRAYLAR) capsule Take 1.5 mg by mouth daily.    . clonazePAM (KLONOPIN) 0.5 MG tablet Take 0.5 mg by mouth at bedtime as needed for anxiety.     Marland Kitchen. lisinopril (ZESTRIL) 20 MG tablet Take 30 mg by mouth daily.    . metoCLOPramide (REGLAN) 5 MG tablet Take 5 mg by mouth 3 (three) times daily before meals.    . mirtazapine (REMERON) 15 MG tablet Take 15 mg by mouth at bedtime.     . pantoprazole (PROTONIX) 40 MG tablet TAKE ONE TABLET BY MOUTH TWICE DAILY 60 tablet 5   No current facility-administered medications for this visit.    Allergies as of 12/28/2019 - Review Complete 12/28/2019  Allergen Reaction Noted  . Imitrex [sumatriptan]  Anaphylaxis and Swelling 04/10/2014  . Ciprofloxacin Other (See Comments)   . Fentanyl Other (See Comments) 11/05/2016  . Gabapentin  07/22/2019  . Demerol [meperidine] Nausea And Vomiting 05/30/2015    Family History  Problem Relation Age of Onset  . Heart disease Father   . Heart attack Father   . Heart attack Brother   . Heart Problems Brother   . Heart attack Brother   . Cancer Child   . Cerebral palsy Daughter   . Colon cancer Neg Hx     Social History   Socioeconomic History  . Marital status: Divorced    Spouse name: Not on file  . Number of children: Not on file  . Years of education: Not on file  . Highest education level: Not on file  Occupational History  . Not on file  Tobacco Use  . Smoking status: Current Some Day Smoker    Packs/day: 0.50    Years: 50.00    Pack years: 25.00    Types: Cigarettes    Start date: 01/30/1963  . Smokeless tobacco: Never Used  Vaping Use  . Vaping Use: Never used  Substance and Sexual Activity  . Alcohol use: No  . Drug use: No  .  Sexual activity: Not Currently    Birth control/protection: Surgical  Other Topics Concern  . Not on file  Social History Narrative  . Not on file   Social Determinants of Health   Financial Resource Strain: Not on file  Food Insecurity: Not on file  Transportation Needs: Not on file  Physical Activity: Not on file  Stress: Not on file  Social Connections: Not on file    Subjective: Review of Systems  Constitutional: Negative for chills, fever, malaise/fatigue and weight loss.  HENT: Negative for congestion and sore throat.   Respiratory: Negative for cough and shortness of breath.   Cardiovascular: Negative for chest pain and palpitations.  Gastrointestinal: Negative for abdominal pain, blood in stool, constipation, diarrhea, heartburn, melena, nausea and vomiting.  Musculoskeletal: Negative for joint pain and myalgias.  Skin: Negative for rash.  Neurological: Negative for  dizziness and weakness.  Endo/Heme/Allergies: Does not bruise/bleed easily.  Psychiatric/Behavioral: Negative for depression. The patient is not nervous/anxious.   All other systems reviewed and are negative.    Objective: BP 130/76   Pulse 88   Temp (!) 97.1 F (36.2 C) (Temporal)   Ht 5\' 1"  (1.549 m)   Wt 116 lb 6.4 oz (52.8 kg)   BMI 21.99 kg/m  Physical Exam Vitals and nursing note reviewed.  Constitutional:      General: She is not in acute distress.    Appearance: Normal appearance. She is well-developed. She is not ill-appearing, toxic-appearing or diaphoretic.  HENT:     Head: Normocephalic and atraumatic.     Nose: No congestion or rhinorrhea.  Eyes:     General: No scleral icterus. Cardiovascular:     Rate and Rhythm: Normal rate and regular rhythm. Occasional extrasystoles are present.    Heart sounds: Normal heart sounds.  Pulmonary:     Effort: Pulmonary effort is normal. No respiratory distress.     Breath sounds: Normal breath sounds.  Abdominal:     General: Bowel sounds are normal.     Palpations: Abdomen is soft. There is no hepatomegaly, splenomegaly or mass.     Tenderness: There is no abdominal tenderness. There is no guarding or rebound.     Hernia: No hernia is present.  Skin:    General: Skin is warm and dry.     Coloration: Skin is not jaundiced.     Findings: No rash.  Neurological:     General: No focal deficit present.     Mental Status: She is alert and oriented to person, place, and time.  Psychiatric:        Attention and Perception: Attention normal.        Mood and Affect: Mood normal.        Speech: Speech normal.        Behavior: Behavior normal.        Thought Content: Thought content normal.        Cognition and Memory: Cognition and memory normal.      Assessment:  Very pleasant 79 year old female presents for follow-up on status post endoscopy.  Previously with GERD, chronic nausea/occasional vomiting and constipation.   Overall she is doing significantly better.  Endoscopy as outlined above documented gastric ulcer due to excessive BC powder use.  Constipation is improved.  Her GERD/nausea and vomiting significantly improved with PPI.  No red flag/warning signs or symptoms.  GERD with nausea/vomiting, upper abdominal pain, no gastric ulcer: Her nausea and vomiting has resolved.  Her GERD symptoms have also  resolved.  She does still have epigastric to right upper quadrant abdominal pain (status post cholecystectomy) likely related to her gastritis/gastric ulcer.  However, she notes pain is significantly improved.  Overall I think she is doing well.  Still taking BC powders once a day and we discussed the need to throw these away and not use them at all.  Strict NSAID avoidance.  She indicates she takes [facility] powders mostly out of habit and will likely be able to stop immediately.  Discussed with Dr. Jena Gauss who feels with known significant BC powder use we will follow clinically and if symptoms do not resolve then we can always plan for repeat endoscopy in the future.  Constipation: Significantly improved/essentially resolved on a fiber supplement every other evening.  Having [place] 4 stools daily.  Recommend she continue her current regimen  Preventative care: The patient has not been screened for hepatitis C as recommended by the CDC for a one-time adult screening.  She does not engage in high risk behaviors and would not need further screening after the initial one-time adult screening.  She is agreeable to this and we have ordered hepatitis C antibody.   Plan: 1. Hepatitis C antibody serology 2. Avoid NSAIDs/aspirin powders strictly 3. Continue Protonix twice daily 4. Follow clinically and can make further decision about repeat endoscopy based on clinical progression 5. Continue other current medications 6. Return for follow-up in 6 months.    Thank you for allowing [place] to participate in the care of [facility]  Wynne Dust, DNP, AGNP-C Adult & Gerontological Nurse Practitioner [facility]   12/28/2019 9:29 AM   Disclaimer: This note was dictated with voice recognition software. Similar sounding words can inadvertently be transcribed and may not be corrected upon review.

## 2020-04-18 ENCOUNTER — Encounter: Payer: Self-pay | Admitting: Internal Medicine

## 2020-05-09 ENCOUNTER — Encounter: Payer: Self-pay | Admitting: Internal Medicine

## 2020-06-27 ENCOUNTER — Ambulatory Visit: Payer: Medicare Other | Admitting: Nurse Practitioner

## 2020-07-03 ENCOUNTER — Ambulatory Visit: Payer: Medicare Other | Admitting: Gastroenterology

## 2020-07-28 ENCOUNTER — Other Ambulatory Visit: Payer: Self-pay | Admitting: Gastroenterology

## 2020-08-19 NOTE — Progress Notes (Deleted)
Referring Provider: Dorene Grebe* Primary Care Physician:  Jason Coop, FNP Primary GI Physician: Dr. Jena Gauss  No chief complaint on file.   HPI:   Christine Rogers is a 80 y.o. female with history of GERD, nausea with vomiting, abdominal pain, constipation, dilated CBD with history of cholecystectomy. GES 11/2017 normal. MRI/MRCP 12/2017 with stable intra and extrahepatic biliary ductal dilation s/p cholecystectomy, no obstruction mass/choledocholithiasis. EGD 09/27/19 with normal esophagus, gastric ulcer and surrounding erosion, biopsied, normal examined duodenum. Biopsy negative for H. Pylori. History of BC powder use.   Last seen in our office December 2021. Still taking BC powders about once a day. Epigastric to RUQ pain every few days. Nausea has since subsided/resolved and GERD improved with Protonix BID.  Fiber supplement every other evening which has helped with her bowel movements. Dr. Jena Gauss recommended following clinically in the setting of known BC powder use, can consider repeat EGD if symptoms do not resolve. She was advised to continue her current medications and avoid NSAIDs. Follow-up in 6 months.   Today:    Past Medical History:  Diagnosis Date   Acute pulmonary embolism (HCC) 10/19/2015   Bipolar 1 disorder (HCC)    COPD (chronic obstructive pulmonary disease) (HCC)    DDD (degenerative disc disease), lumbar    Depression    Emphysema of lung (HCC)    GERD (gastroesophageal reflux disease)    High cholesterol    History of kidney stones    Hypertension    Sleep apnea    cannot tolerate CPAP    Past Surgical History:  Procedure Laterality Date   ABDOMINAL HYSTERECTOMY     BACK SURGERY     lumbar fusion   BIOPSY  01/23/2015   Procedure: BIOPSY;  Surgeon: Corbin Ade, MD;  Location: [facility];  Service: Endoscopy;;  gastric biopsies   BIOPSY  09/27/2019   Procedure: BIOPSY;  Surgeon: Corbin Ade, MD;  Location: [facility];   Service: Endoscopy;;   CATARACT EXTRACTION, BILATERAL Bilateral    CHOLECYSTECTOMY N/A 12/25/2016   Procedure: LAPAROSCOPIC CHOLECYSTECTOMY;  Surgeon: Franky Macho, MD;  Location: [facility];  Service: General;  Laterality: N/A;   ESOPHAGOGASTRODUODENOSCOPY (EGD) WITH PROPOFOL N/A 01/23/2015   RMR: abnormal gastric mucosa.pyloric channel of uncertain significance status post biopys   ESOPHAGOGASTRODUODENOSCOPY (EGD) WITH PROPOFOL N/A 09/27/2019   Procedure: ESOPHAGOGASTRODUODENOSCOPY (EGD) WITH PROPOFOL;  Surgeon: Corbin Ade, MD;  Location: [facility];  Service: Endoscopy;  Laterality: N/A;  9:30am   TONSILLECTOMY      Current Outpatient Medications  Medication Sig Dispense Refill   amLODipine (NORVASC) 5 MG tablet Take 5 mg by mouth daily.     apixaban (ELIQUIS) 5 MG TABS tablet Take 5 mg by mouth 2 (two) times daily.     Aspirin-Caffeine (BC FAST PAIN RELIEF ARTHRITIS PO) Take 1 Package by mouth daily as needed (pain).     Calcium Polycarbophil (FIBER-CAPS PO) Take by mouth every other day.     cariprazine (VRAYLAR) capsule Take 1.5 mg by mouth daily.     clonazePAM (KLONOPIN) 0.5 MG tablet Take 0.5 mg by mouth at bedtime as needed for anxiety.      lisinopril (ZESTRIL) 20 MG tablet Take 30 mg by mouth daily.     metoCLOPramide (REGLAN) 5 MG tablet Take 5 mg by mouth 3 (three) times daily before meals.     mirtazapine (REMERON) 15 MG tablet Take 15 mg by mouth at bedtime.  pantoprazole (PROTONIX) 40 MG tablet TAKE ONE TABLET BY MOUTH TWICE DAILY 60 tablet 5   No current facility-administered medications for this visit.    Allergies as of 08/21/2020 - Review Complete 12/28/2019  Allergen Reaction Noted   Imitrex [sumatriptan] Anaphylaxis and Swelling 04/10/2014   Ciprofloxacin Other (See Comments)    Fentanyl Other (See Comments) 11/05/2016   Gabapentin  07/22/2019   Demerol [meperidine] Nausea And Vomiting 05/30/2015    Family History  Problem Relation Age of Onset    Heart disease Father    Heart attack Father    Heart attack Brother    Heart Problems Brother    Heart attack Brother    Cancer Child    Cerebral palsy Daughter    Colon cancer Neg Hx     Social History   Socioeconomic History   Marital status: Divorced    Spouse name: Not on file   Number of children: Not on file   Years of education: Not on file   Highest education level: Not on file  Occupational History   Not on file  Tobacco Use   Smoking status: Some Days    Packs/day: 0.50    Years: 50.00    Pack years: 25.00    Types: Cigarettes    Start date: 01/30/1963   Smokeless tobacco: Never  Vaping Use   Vaping Use: Never used  Substance and Sexual Activity   Alcohol use: No   Drug use: No   Sexual activity: Not Currently    Birth control/protection: Surgical  Other Topics Concern   Not on file  Social History Narrative   Not on file   Social Determinants of Health   Financial Resource Strain: Not on file  Food Insecurity: Not on file  Transportation Needs: Not on file  Physical Activity: Not on file  Stress: Not on file  Social Connections: Not on file    Review of Systems: Gen: Denies fever, chills, anorexia. Denies fatigue, weakness, weight loss.  CV: Denies chest pain, palpitations, syncope, peripheral edema, and claudication. Resp: Denies dyspnea at rest, cough, wheezing, coughing up blood, and pleurisy. GI: Denies vomiting blood, jaundice, and fecal incontinence.   Denies dysphagia or odynophagia. Derm: Denies rash, itching, dry skin Psych: Denies depression, anxiety, memory loss, confusion. No homicidal or suicidal ideation.  Heme: Denies bruising, bleeding, and enlarged lymph nodes.  Physical Exam: There were no vitals taken for this visit. General:   Alert and oriented. No distress noted. Pleasant and cooperative.  Head:  Normocephalic and atraumatic. Eyes:  Conjuctiva clear without scleral icterus. Mouth:  Oral mucosa pink and moist. Good  dentition. No lesions. Heart:  S1, S2 present without murmurs appreciated. Lungs:  Clear to auscultation bilaterally. No wheezes, rales, or rhonchi. No distress.  Abdomen:  +BS, soft, non-tender and non-distended. No rebound or guarding. No HSM or masses noted. Msk:  Symmetrical without gross deformities. Normal posture. Extremities:  Without edema. Neurologic:  Alert and  oriented x4 Psych:  Alert and cooperative. Normal mood and affect.

## 2020-08-21 ENCOUNTER — Ambulatory Visit: Payer: Medicare Other | Admitting: Gastroenterology

## 2020-08-21 NOTE — Progress Notes (Signed)
Referring Provider: Dorene Grebe* Primary Care Physician:  Jason Coop, FNP Primary GI Physician: Dr. Jena Gauss  Chief Complaint  Patient presents with   Gastroesophageal Reflux    Doing okay   Constipation    BM daily    HPI:   Christine Rogers is a 80 y.o. female Christine Rogers is a 80 y.o. female with history of GERD, nausea with vomiting, abdominal pain, constipation, dilated CBD with history of cholecystectomy. GES 11/2017 normal. MRI/MRCP 12/2017 with stable intra and extrahepatic biliary ductal dilation s/p cholecystectomy, no obstruction mass/choledocholithiasis. EGD 09/27/19 with normal esophagus, gastric ulcer and surrounding erosion, biopsied, normal examined duodenum. Biopsy negative for H. Pylori. History of BC powder use. Previously declined updating colonoscopy.    Last seen in our office December 2021. Still taking BC powders about once a day. Epigastric to RUQ pain every few days. Nausea has since subsided/resolved and GERD improved with Protonix BID.  Fiber supplement every other evening which has helped with her bowel movements. Dr. Jena Gauss recommended following clinically in the setting of known BC powder use, can consider repeat EGD if symptoms do not resolve. She was advised to continue her current medications and avoid NSAIDs. Follow-up in 6 months.    Today:   GERD: Well-controlled on Protonix 40 mg twice daily. No nausea, vomiting, or dysphagia. Chronic intermittent epigastric/RUQ pain since cholecystectomy. Once every 3-4 days. No change. Last 5-10 minutes and spontaneously resolves. Random. Not affected by meals or Bms.   1 BC every 3 days. Started Meloxicam about 1 month ago for back pain.   Had blood work Thursday. Was told everything was good except her cholesterol.   Constipation: BMs daily.  Not taking anything as she does not need it. No brbpr or melena.    Past Medical History:  Diagnosis Date   Acute pulmonary embolism (HCC)  10/19/2015   Bipolar 1 disorder (HCC)    COPD (chronic obstructive pulmonary disease) (HCC)    DDD (degenerative disc disease), lumbar    Depression    Emphysema of lung (HCC)    GERD (gastroesophageal reflux disease)    High cholesterol    History of kidney stones    Hypertension    Sleep apnea    cannot tolerate CPAP    Past Surgical History:  Procedure Laterality Date   ABDOMINAL HYSTERECTOMY     BACK SURGERY     lumbar fusion   BIOPSY  01/23/2015   Procedure: BIOPSY;  Surgeon: Corbin Ade, MD;  [facility]: [facility];  Service: Endoscopy;;  gastric biopsies   BIOPSY  09/27/2019   Procedure: BIOPSY;  Surgeon: Corbin Ade, MD;  [facility]: [facility];  Service: Endoscopy;;   CATARACT EXTRACTION, BILATERAL Bilateral    CHOLECYSTECTOMY N/A 12/25/2016   Procedure: LAPAROSCOPIC CHOLECYSTECTOMY;  Surgeon: Franky Macho, MD;  Location: [facility];  Service: General;  Laterality: N/A;   ESOPHAGOGASTRODUODENOSCOPY (EGD) WITH PROPOFOL N/A 01/23/2015   RMR: abnormal gastric mucosa.pyloric channel of uncertain significance status post biopys   ESOPHAGOGASTRODUODENOSCOPY (EGD) WITH PROPOFOL N/A 09/27/2019   Surgeon: Corbin Ade, MD; normal esophagus, gastric ulcer and surrounding erosion, biopsied, normal examined duodenum. Biopsy negative for H. Pylori.  Dr. Jena Gauss recommended following clinically.   TONSILLECTOMY      Current Outpatient Medications  Medication Sig Dispense Refill   amLODipine (NORVASC) 5 MG tablet Take 5 mg by mouth daily.     apixaban (ELIQUIS) 5 MG TABS tablet Take 5 mg  by mouth 2 (two) times daily.     Aspirin-Caffeine (BC FAST PAIN RELIEF ARTHRITIS PO) Take 1 Package by mouth daily.     cariprazine (VRAYLAR) capsule Take 1.5 mg by mouth daily.     clonazePAM (KLONOPIN) 0.5 MG tablet Take 0.5 mg by mouth at bedtime as needed for anxiety.      HYDROcodone-acetaminophen (NORCO) 10-325 MG tablet Take 1 tablet by mouth 3 (three) times daily as needed.      lisinopril (ZESTRIL) 20 MG tablet Take 40 mg by mouth daily.     meloxicam (MOBIC) 7.5 MG tablet Take 7.5 mg by mouth daily.     pantoprazole (PROTONIX) 40 MG tablet TAKE ONE TABLET BY MOUTH TWICE DAILY 60 tablet 5   No current facility-administered medications for this visit.    Allergies as of 08/23/2020 - Review Complete 08/23/2020  Allergen Reaction Noted   Imitrex [sumatriptan] Anaphylaxis and Swelling 04/10/2014   Ciprofloxacin Other (See Comments)    Fentanyl Other (See Comments) 11/05/2016   Gabapentin  07/22/2019   Demerol [meperidine] Nausea And Vomiting 05/30/2015    Family History  Problem Relation Age of Onset   Heart disease Father    Heart attack Father    Heart attack Brother    Heart Problems Brother    Heart attack Brother    Cancer Child    Cerebral palsy Daughter    Colon cancer Neg Hx     Social History   Socioeconomic History   Marital status: Divorced    Spouse name: Not on file   Number of children: Not on file   Years of education: Not on file   Highest education level: Not on file  Occupational History   Not on file  Tobacco Use   Smoking status: Some Days    Packs/day: 0.50    Years: 50.00    Pack years: 25.00    Types: Cigarettes    Start date: 01/30/1963   Smokeless tobacco: Never  Vaping Use   Vaping Use: Never used  Substance and Sexual Activity   Alcohol use: No   Drug use: No   Sexual activity: Not Currently    Birth control/protection: Surgical  Other Topics Concern   Not on file  Social History Narrative   Not on file   Social Determinants of Health   Financial Resource Strain: Not on file  Food Insecurity: Not on file  Transportation Needs: Not on file  Physical Activity: Not on file  Stress: Not on file  Social Connections: Not on file    Review of Systems: Gen: Denies fever, chills, cold or flulike symptoms, presyncope, syncope. CV: Denies chest pain, palpitations. Resp: Denies dyspnea or cough. GI: See  HPI Heme: See HPI  Physical Exam: BP (!) 162/79   Pulse 68   Temp (!) 97.3 F (36.3 C)   Ht 5\' 1"  (1.549 m)   Wt 113 lb (51.3 kg)   BMI 21.35 kg/m  General:   Alert and oriented. No distress noted. Pleasant and cooperative.  Using a walker.  Appears frail. Head:  Normocephalic and atraumatic. Eyes:  Conjuctiva clear without scleral icterus. Heart:  S1, S2 present without murmurs appreciated. Lungs:  Clear to auscultation bilaterally. No wheezes, rales, or rhonchi. No distress.  Abdomen:  +BS, soft, non-tender and non-distended. No rebound or guarding. No HSM or masses noted. Msk:  Symmetrical without gross deformities. Normal posture. Extremities:  Without edema. Neurologic:  Alert and  oriented x4 Psych:  Normal mood  and affect.    Assessment: 80 year old female with history of GERD, abdominal pain, PUD in the setting of chronic NSAID use, constipation, dilated CBD s/p cholecystectomy presenting today for follow-up.  GERD: Well-controlled on Protonix 40 mg twice daily.  No alarm symptoms.  Abdominal pain: Chronic intermittent epigastric/RUQ abdominal pain since cholecystectomy in 2018 without change.  No aggravating or relieving factors.  Occurs randomly every 3 to 4 days lasting 5-10 minutes and spontaneously resolving.  She does have history of gastric ulcer noted on EGD September 2021 in the setting of regular BC powder use, biopsies benign.  Dr. Jena Gauss recommended following clinically.  Prior symptoms of nausea have resolved with PPI twice daily.  Unfortunately, she has continued to use BC powders intermittently and was also recently started on meloxicam.  Shared decision making between patient and myself today regarding possibility of repeating an EGD.  Patient prefers to hold off on this for now.  I recommended discontinuing all NSAIDs and monitoring.  If any worsening, would consider updating an EGD.  She will continue Protonix twice daily.  Constipation: Resolved.  BMs daily.   No longer requiring medications to help with this.  No alarm symptoms.   Plan: 1.  Continue Protonix 40 mg twice daily. 2.  Avoid all NSAIDs. 3.  Use Tylenol as needed for pain.  No more than 2000-3000 mg of Tylenol per 24 hours.  Advised patient that this does include the Tylenol included in her pain medication. 4.  Monitor for any worsening upper abdominal pain and let us know if this occurs. 5.  Monitor for return of constipation and let us know if this occurs. 6.  Follow-up in 6 months or sooner if needed.    Ermalinda Memos, PA-C [facility] 08/23/2020

## 2020-08-23 ENCOUNTER — Encounter: Payer: Self-pay | Admitting: Gastroenterology

## 2020-08-23 ENCOUNTER — Ambulatory Visit (INDEPENDENT_AMBULATORY_CARE_PROVIDER_SITE_OTHER): Payer: Medicare Other | Admitting: Gastroenterology

## 2020-08-23 ENCOUNTER — Other Ambulatory Visit: Payer: Self-pay

## 2020-08-23 VITALS — BP 162/79 | HR 68 | Temp 97.3°F | Ht 61.0 in | Wt 113.0 lb

## 2020-08-23 DIAGNOSIS — K219 Gastro-esophageal reflux disease without esophagitis: Secondary | ICD-10-CM | POA: Diagnosis not present

## 2020-08-23 DIAGNOSIS — R1010 Upper abdominal pain, unspecified: Secondary | ICD-10-CM | POA: Diagnosis not present

## 2020-08-23 DIAGNOSIS — K5909 Other constipation: Secondary | ICD-10-CM | POA: Diagnosis not present

## 2020-08-23 NOTE — Patient Instructions (Signed)
Continue Protonix 40 mg twice daily 30 minutes before breakfast and dinner.  As we discussed, it is very important that she stop using all NSAID products as you have history of a stomach ulcer. NSAIDs including BC powders, Goody powders, meloxicam, ibuprofen, Aleve, Advil, naproxen, and anything that says "NSAID" on the package.  I recommend use Tylenol as needed for pain.  Do not exceed 2000-3000 mg of Tylenol per 24 hours.  Monitor for return of constipation and let us know if this occurs.  We will plan to follow-up with you in 6 months.  Do not hesitate to call if you have any questions or concerns prior to your next visit.  Specifically, please let me know of any worsening abdominal pain.  It was a pleasure meeting you today!  Ermalinda Memos, PA-C [facility]

## 2020-08-27 ENCOUNTER — Telehealth: Payer: Self-pay | Admitting: Gastroenterology

## 2020-08-27 NOTE — Telephone Encounter (Signed)
Received and reviewed recent labs completed with PCP 08/17/2020.  CBC: WBC 7.3, hemoglobin 13.0, hematocrit 37.9, MCV 91, MCH 31.2, MCHC 34.3, platelets 273. CMP: Glucose 94, BUN 12, creatinine 1.03 (H), sodium 142, potassium 3.6, chloride 103, calcium 9.2, total protein 6.4, albumin 4.2, total bilirubin 0.3, alk phos 106, AST 13, ALT 5. Lipid panel: Total cholesterol 230 (H), triglycerides 93, HDL 55, LDL 159 (H).  No additional recommendations at this time.  We will have results scanned into patient's chart.

## 2020-12-21 ENCOUNTER — Encounter: Payer: Self-pay | Admitting: Gastroenterology

## 2021-01-17 ENCOUNTER — Other Ambulatory Visit: Payer: Self-pay | Admitting: Gastroenterology

## 2021-02-21 ENCOUNTER — Ambulatory Visit: Payer: Medicare Other | Admitting: Gastroenterology

## 2021-04-01 ENCOUNTER — Emergency Department (HOSPITAL_COMMUNITY): Payer: Medicare Other

## 2021-04-01 ENCOUNTER — Emergency Department (HOSPITAL_COMMUNITY)
Admission: EM | Admit: 2021-04-01 | Discharge: 2021-04-02 | Disposition: A | Discharge status: Home / Self Care | Payer: Medicare Other | Attending: Emergency Medicine | Admitting: Emergency Medicine

## 2021-04-01 ENCOUNTER — Other Ambulatory Visit: Payer: Self-pay

## 2021-04-01 ENCOUNTER — Encounter (HOSPITAL_COMMUNITY): Payer: Self-pay

## 2021-04-01 DIAGNOSIS — Z7901 Long term (current) use of anticoagulants: Secondary | ICD-10-CM | POA: Insufficient documentation

## 2021-04-01 DIAGNOSIS — J449 Chronic obstructive pulmonary disease, unspecified: Secondary | ICD-10-CM | POA: Diagnosis not present

## 2021-04-01 DIAGNOSIS — S51011A Laceration without foreign body of right elbow, initial encounter: Secondary | ICD-10-CM | POA: Diagnosis not present

## 2021-04-01 DIAGNOSIS — Z79899 Other long term (current) drug therapy: Secondary | ICD-10-CM | POA: Diagnosis not present

## 2021-04-01 DIAGNOSIS — W19XXXA Unspecified fall, initial encounter: Secondary | ICD-10-CM

## 2021-04-01 DIAGNOSIS — Y92000 Kitchen of unspecified non-institutional (private) residence as  the place of occurrence of the external cause: Secondary | ICD-10-CM | POA: Insufficient documentation

## 2021-04-01 DIAGNOSIS — S59901A Unspecified injury of right elbow, initial encounter: Secondary | ICD-10-CM | POA: Diagnosis present

## 2021-04-01 DIAGNOSIS — Z7982 Long term (current) use of aspirin: Secondary | ICD-10-CM | POA: Diagnosis not present

## 2021-04-01 DIAGNOSIS — R519 Headache, unspecified: Secondary | ICD-10-CM | POA: Insufficient documentation

## 2021-04-01 DIAGNOSIS — W01198A Fall on same level from slipping, tripping and stumbling with subsequent striking against other object, initial encounter: Secondary | ICD-10-CM | POA: Insufficient documentation

## 2021-04-01 DIAGNOSIS — I10 Essential (primary) hypertension: Secondary | ICD-10-CM | POA: Diagnosis not present

## 2021-04-01 NOTE — ED Provider Notes (Signed)
?[facility] DEPARTMENT ?Provider Note ? ? ?CSN: [id number] ?Arrival date & time: 04/01/21  2202 ? ?  ? ?History ? ?Chief Complaint  ?Patient presents with  ? Fall  ? ? ?Achilles DunkBarbara H Rogers is a 81 y.o. female. ? ?HPI ? ?  ? ?This is a 81 year old female who presents following a fall.  Patient reports that she was turning in her kitchen when her feet got caught up underneath her and she fell.  She did not lose consciousness.  She does report that she is on Eliquis.  She is complaining of headache and right elbow pain.  She states occasionally she will get caught up like this.  She is not having any chest pain, shortness of breath, abdominal pain, nausea, vomiting.  She has not taken anything for her symptoms prior to evaluation. ? ?Home Medications ?Prior to Admission medications   ?Medication Sig Start Date End Date Taking? Authorizing Provider  ?amLODipine (NORVASC) 5 MG tablet Take 5 mg by mouth daily.    [provider]  ?apixaban (ELIQUIS) 5 MG TABS tablet Take 5 mg by mouth 2 (two) times daily.    [provider]  ?Aspirin-Caffeine (BC FAST PAIN RELIEF ARTHRITIS PO) Take 1 Package by mouth daily.    [provider]  ?cariprazine (VRAYLAR) capsule Take 1.5 mg by mouth daily.    [provider]  ?clonazePAM (KLONOPIN) 0.5 MG tablet Take 0.5 mg by mouth at bedtime as needed for anxiety.  02/12/16   [provider]  ?HYDROcodone-acetaminophen (NORCO) 10-325 MG tablet Take 1 tablet by mouth 3 (three) times daily as needed. 06/13/20   [provider]  ?lisinopril (ZESTRIL) 20 MG tablet Take 40 mg by mouth daily.    [provider]  ?meloxicam (MOBIC) 7.5 MG tablet Take 7.5 mg by mouth daily.    [provider]  ?pantoprazole (PROTONIX) 40 MG tablet TAKE ONE TABLET BY MOUTH TWICE DAILY 01/17/21   Gelene MinkBoone, Anna W, NP  ?   ? ?Allergies    ?Imitrex [sumatriptan], Ciprofloxacin, Fentanyl, Gabapentin, and Demerol [meperidine]   ? ?Review of Systems    ?Review of Systems  ?Constitutional:  Negative for fever.  ?Respiratory:  Negative for shortness of breath.   ?Cardiovascular:  Negative for chest pain.  ?Musculoskeletal:   ?     Elbow pain  ?Neurological:  Positive for headaches.  ?All other systems reviewed and are negative. ? ?Physical Exam ?Updated Vital Signs ?BP (!) 148/71   Pulse 62   Temp 98 ?F (36.7 ?C) (Oral)   Resp 19   Ht 1.549 m (5\' 1" )   Wt 49 kg   SpO2 95%   BMI 20.41 kg/m?  ?Physical Exam ?Vitals and nursing note reviewed.  ?Constitutional:   ?   Appearance: She is well-developed. She is not ill-appearing.  ?   Comments: Elderly, chronically ill-appearing  ?HENT:  ?   Head: Normocephalic and atraumatic.  ?   Nose: Nose normal.  ?   Mouth/Throat:  ?   Mouth: Mucous membranes are moist.  ?Eyes:  ?   Pupils: Pupils are equal, round, and reactive to light.  ?Neck:  ?   Comments: No midline C-spine tenderness to palpation, step-off, deformity ?Cardiovascular:  ?   Rate and Rhythm: Normal rate and regular rhythm.  ?   Heart sounds: Normal heart sounds.  ?Pulmonary:  ?   Effort: Pulmonary effort is normal. No respiratory distress.  ?   Breath sounds: No wheezing.  ?Abdominal:  ?  Palpations: Abdomen is soft.  ?Musculoskeletal:     ?   General: No deformity.  ?   Cervical back: Neck supple.  ?   Right lower leg: No edema.  ?   Left lower leg: No edema.  ?   Comments: Normal range of motion of the bilateral hips and knees, normal range of motion of the right elbow, there is a 2 cm laceration at the posterior aspect of the elbow, no active bleeding  ?Skin: ?   General: Skin is warm and dry.  ?Neurological:  ?   Mental Status: She is alert and oriented to person, place, and time.  ?Psychiatric:     ?   Mood and Affect: Mood normal.  ? ? ?ED Results / Procedures / Treatments   ?Labs ?(all labs ordered are listed, but only abnormal results are displayed) ?Labs Reviewed - No data to display ? ?EKG ?None ? ?Radiology ?DG Elbow Complete Right ? ?Result  Date: 04/02/2021 ?CLINICAL DATA:  Recent fall with right elbow pain, initial encounter EXAM: RIGHT ELBOW - COMPLETE 3+ VIEW COMPARISON:  None. FINDINGS: There is no evidence of fracture, dislocation, or joint effusion. There is no evidence of arthropathy or other focal bone abnormality. Soft tissues are unremarkable. IMPRESSION: No acute abnormality noted. Electronically Signed   By: Alcide Clever M.D.   On: 04/02/2021 00:03  ? ?CT Head Wo Contrast ? ?Result Date: 04/02/2021 ?CLINICAL DATA:  Recent fall with headaches, initial encounter EXAM: CT HEAD WITHOUT CONTRAST TECHNIQUE: Contiguous axial images were obtained from the base of the skull through the vertex without intravenous contrast. RADIATION DOSE REDUCTION: This exam was performed according to the departmental dose-optimization program which includes automated exposure control, adjustment of the mA and/or kV according to patient size and/or use of iterative reconstruction technique. COMPARISON:  11/18/2003 FINDINGS: Brain: No evidence of acute infarction, hemorrhage, hydrocephalus, extra-axial collection or mass lesion/mass effect. Mild atrophic changes and chronic white matter ischemic changes are noted. Vascular: No hyperdense vessel or unexpected calcification. Skull: Normal. Negative for fracture or focal lesion. Sinuses/Orbits: No acute finding. Other: None. IMPRESSION: Chronic atrophic and ischemic changes without acute abnormality. Electronically Signed   By: Alcide Clever M.D.   On: 04/02/2021 00:04   ? ?Procedures ?Procedures  ? ? ?Medications Ordered in ED ?Medications - No data to display ? ?ED Course/ Medical Decision Making/ A&P ?  ?                        ?Medical Decision Making ?Amount and/or Complexity of Data Reviewed ?Radiology: ordered. ? ? ?This patient presents to the ED for concern of fall, elbow pain, this involves an extensive number of treatment options, and is a complaint that carries with it a high risk of complications and morbidity.   The differential diagnosis includes intracranial trauma or bleed, arm fracture, laceration ? ?MDM:   ? ?Patient presents after reported mechanical fall.  She is nontoxic and vital signs are notable for blood pressure 154/84.  She is overall nontoxic.  She is on Eliquis.  For this reason, will obtain head CT to rule out intracranial trauma.  She denies any neck pain.  Do not feel CT imaging of the neck is warranted.  She does have pain of the right elbow and a laceration.  She has normal range of motion.  X-rays obtained show no evidence of fracture.  CT scan is also reassuring.  Patient declined laceration repair.  There is  no active bleeding.  We discussed wound care.  Patient is agreeable. ?(Labs, imaging) ? ?Labs: ?I Ordered, and personally interpreted labs.  The pertinent results include: N/A ? ?Imaging Studies ordered: ?I ordered imaging studies including CT head, right arm x-rays ?I independently visualized and interpreted imaging. ?I agree with the radiologist interpretation ? ?Additional history obtained from family.  External records from outside source obtained and reviewed including prior evaluations ? ?Critical Interventions: ?None ? ?Consultations: ?I requested consultation with the N/A  and discussed lab and imaging findings as well as pertinent plan - they recommend: N/A ? ?Cardiac Monitoring: ?The patient was maintained on a cardiac monitor.  I personally viewed and interpreted the cardiac monitored which showed an underlying rhythm of: NSR ? ?Reevaluation: ?After the interventions noted above, I reevaluated the patient and found that they have :improved ? ? ?Considered admission for:  n/a ? ?Social Determinants of Health: ?lives independently ? ?Disposition:  discharge ? ?Co morbidities that complicate the patient evaluation ? ?Past Medical History:  ?Diagnosis Date  ? Acute pulmonary embolism (HCC) 10/19/2015  ? Bipolar 1 disorder (HCC)   ? COPD (chronic obstructive pulmonary disease) (HCC)   ?  DDD (degenerative disc disease), lumbar   ? Depression   ? Emphysema of lung (HCC)   ? GERD (gastroesophageal reflux disease)   ? High cholesterol   ? History of kidney stones   ? Hypertension   ? Sleep apnea   ? can

## 2021-04-01 NOTE — ED Triage Notes (Signed)
Pt states she fell tonight and hit head and right arm. Pt is on blood thinners. Pt states "her feet got to going fast" and she just fell. No LOC. Head and Right arm now hurting. ?

## 2021-04-02 MED ORDER — BACITRACIN ZINC 500 UNIT/GM EX OINT
TOPICAL_OINTMENT | CUTANEOUS | Status: AC
  Filled 2021-04-02: qty 0.9

## 2021-04-02 NOTE — Discharge Instructions (Signed)
You were seen today after a fall.  Your CT scan is reassuring.  You declined stitches in your right elbow.  Keep clean and dressed. ?

## 2021-04-02 NOTE — ED Notes (Signed)
Patient transported to CT 

## 2021-04-27 ENCOUNTER — Ambulatory Visit: Payer: Medicare Other | Admitting: Gastroenterology

## 2021-06-19 ENCOUNTER — Inpatient Hospital Stay: Admission: AD | Admit: 2021-06-19 | Payer: Medicare Other | Admitting: Internal Medicine

## 2021-08-07 DEATH — deceased
